# Patient Record
Sex: Female | Born: 1975
Health system: Southern US, Community
[De-identification: ages and names within clinical notes are randomized; demographics above are authoritative.]

---

## 1997-07-22 HISTORY — PX: HIP ARTHROSCOPY W/ LABRAL REPAIR: SHX1750

## 2002-08-16 ENCOUNTER — Emergency Department (HOSPITAL_COMMUNITY): Admission: EM | Admit: 2002-08-16 | Discharge: 2002-08-16 | Payer: Self-pay | Admitting: Emergency Medicine

## 2002-11-08 ENCOUNTER — Other Ambulatory Visit: Admission: RE | Admit: 2002-11-08 | Discharge: 2002-11-08 | Payer: Self-pay | Admitting: Internal Medicine

## 2004-02-25 ENCOUNTER — Emergency Department (HOSPITAL_COMMUNITY): Admission: EM | Admit: 2004-02-25 | Discharge: 2004-02-25 | Payer: Self-pay | Admitting: Emergency Medicine

## 2004-02-26 ENCOUNTER — Emergency Department (HOSPITAL_COMMUNITY): Admission: EM | Admit: 2004-02-26 | Discharge: 2004-02-26 | Payer: Self-pay | Admitting: Emergency Medicine

## 2004-02-26 ENCOUNTER — Emergency Department (HOSPITAL_COMMUNITY): Admission: EM | Admit: 2004-02-26 | Discharge: 2004-02-26 | Payer: Self-pay | Admitting: Family Medicine

## 2004-02-28 ENCOUNTER — Encounter: Admission: RE | Admit: 2004-02-28 | Discharge: 2004-02-28 | Payer: Self-pay | Admitting: Internal Medicine

## 2004-11-02 ENCOUNTER — Ambulatory Visit: Payer: Self-pay | Admitting: Infectious Diseases

## 2004-11-02 ENCOUNTER — Inpatient Hospital Stay (HOSPITAL_COMMUNITY): Admission: EM | Admit: 2004-11-02 | Discharge: 2004-11-07 | Payer: Self-pay | Admitting: Emergency Medicine

## 2004-11-07 ENCOUNTER — Ambulatory Visit: Payer: Self-pay | Admitting: Cardiology

## 2004-11-07 ENCOUNTER — Encounter: Payer: Self-pay | Admitting: Cardiology

## 2007-07-04 ENCOUNTER — Emergency Department (HOSPITAL_COMMUNITY): Admission: EM | Admit: 2007-07-04 | Discharge: 2007-07-04 | Payer: Self-pay | Admitting: Family Medicine

## 2010-12-07 NOTE — Discharge Summary (Signed)
NAME:  Patricia Liu, Patricia Liu NO.:  [id number]   MEDICAL RECORD NO.:  [id number]          PATIENT TYPE:  INP   LOCATION:  5738                         FACILITY:  [facility]   PHYSICIAN:  Michaelyn Barter, M.D. DATE OF BIRTH:  10/04/1975   DATE OF ADMISSION:  11/01/2004  DATE OF DISCHARGE:  11/07/2004                                 DISCHARGE SUMMARY   PRIMARY CARE PHYSICIAN:  Unassigned.   FINAL DIAGNOSES:  1.  Mesenteric adenitis.  2.  Shortness of breath.  3.  Elevated liver enzymes.   CONSULTANTS:  1.  Bernette Redbird, M.D., GI.  2.  Lacretia Leigh. Ninetta Lights, M.D., infectious disease.  3.  General surgery.  4.  OB/GYN.   HISTORY OF PRESENT ILLNESS:  Mrs. Bullen is a 35 year old female who  arrived in the emergency room with a chief complaint of abdominal pain and  fever over the prior 24 hours. The pain was located within the right lower  quadrant and she had been seen in the Urgent Care earlier the day of  admission. At that time, her white blood cell count was noted to be elevated  and she was referred to the hospital for further evaluation. She also  complained of some mild nausea but no vomiting.   PAST MEDICAL HISTORY:  Asthma.   PAST SURGICAL HISTORY:  Right hip arthrocentesis.   ALLERGIES:  SULFA.   FAMILY HISTORY:  Negative.   SOCIAL HISTORY:  Cigarettes:  The patient denied.  Alcohol: The patient  denied.   HOSPITAL COURSE:  Problem 1:  MESENTERIC ADENITIS:  The patient had a CT  scan of the abdomen completed and the final impression of which was abnormal  retroperitoneal stranding and stranding in the root of the mesentery with  associated like inflammatory retroperitoneal adenopathy, along with  inflammation of the adjacent structures including the right ureter and  duodenal sweep. Prominence of the head of the pancreas was also seen. No  definite mass was seen and the appearance was suggestive of common  mesenteric adenitis. Final impression from the  CT of the pelvis was that  there was abnormal retroperitoneal stranding extending slightly along the  pelvic sidewall, right greater than the left, hypodense appearance of the  right ovary potentially representing a complex right ovarian lesion, no free  pelvic fluid, the appendix did not appear to be inflamed. Because of these  findings, a transvaginal ultrasound was completed which revealed a 2 cm soft  tissue abnormality adjacent to the right ovary questionable arising from the  right ovary or representing an area of inflammation or more solid soft  tissue. Further evaluation was recommended at six to eight weeks. Following  this, the patient was started on empiric IV antibiotics with Zosyn. General  surgery was consulted and Dr. Violeta Gelinas was the physician who responded  to the consultation. He agreed that the patient should have a medical  admission along with IV fluid resuscitation and Zosyn and he stated that  there were no acute surgical issues at that particular time. In addition,  OB/GYN was consulted and Dr. Senaida Ores was  the physician who responded. At  that particular time, she had no additional recommendations but stated that  the patient was at low risk for PID and recommended observing the patient  for improvement on antibiotics. Over the course of the patient's  hospitalization, her abdominal pain gradually started to improve; however,  she had further opinion as to whether or not correct antibiotics were being  used. Infectious disease was consulted for further recommendations and their  final recommendations were that the patient actually did not need to be on  any antibiotics given her diagnosis.   Problem 2:  ELEVATED LIVER ENZYMES:  By April the 18th, the patient's liver  enzymes were noted to have gone from 30 to 134 for the SGOT. Her SGPT had  increased from 20 to 116. Therefore the decision was made to consult GI, Dr.  Bernette Redbird. Dr. Molly Maduro Buccini  evaluated the patient. He stated that the  patient more than likely had a reactive hepatopathy from infection or  medications or biliary obstruction from a common bile duct stone. He  recommended an ultrasound of the abdomen to follow the LFT's and a possible  to MRI or ERCP depending upon the results. An ultrasound of the abdomen was  completed on November 07, 2004. The final impression was completely distended  gallbladder containing a small amount of sludge, otherwise normal  examination. He went, on the following day, to state that he did not think  that any further GI evaluation was needed. The patient's antibiotics were  subsequently stopped and the patient's liver enzymes no longer continued to  increase.   Problem 3:  SHORTNESS OF BREATH:  The patient complained of some shortness  of breath over the course of her hospitalization. She had a chest x-ray  completed on April 16 which revealed interval development of bibasilar air  space disease, left greater than the right and it revealed that the findings  were more than likely compatible with infection. She had already been on  empiric antibiotics for this. Likewise to follow with results of that chest  x-ray she had a repeat chest x-ray completed on April 18th which revealed a  slight residual interstitial prominence and right base opacity significant  for improvement since the previous study done on April 16th. In addition,  the patient also had a 2-D echocardiogram completed on April 19th which was  interpreted as overall left ventricular systolic function was normal. Left  ventricular ejection fraction was estimated to range between 55-65%. There  were no left ventricular regional wall motion abnormalities. 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 By April 19th, the patient was stating  that she felt much better and that she wanted to go home. She had no shortness of breath at that particular time and her abdominal pain  had  improved significantly. Likewise, the patient's leukocytosis which should be  a number four above had also resolved. Therefore the decision was made to  discharge the patient home. The patient's vitals at the time of discharge  showed her temperature was 98.5, heart rate 98, respirations 20, blood  pressure 109/69 and she 96% on room air. The decision was made to discharge  the patient home. The patient was discharged home on the following  medications.   DISCHARGE MEDICATIONS:  1.  Z-Pak.  2.  Flexeril 5 milligrams p.o. t.i.d.  3.  Atrovent 0.5 milligrams MDI q.6h.  4.  Albuterol 2.5 milligrams MDI q.6h.  5.  Phenergan 12.5 milligrams p.o.  q.8h.   DISCHARGE INSTRUCTIONS:  The patient was instructed to see Dr. Bernette Redbird on Nov 22, 2004 for further evaluation in follow-up.       OR/MEDQ  D:  01/13/2005  T:  01/14/2005  Job:  [id number]

## 2010-12-07 NOTE — H&P (Signed)
NAME:  Patricia Liu, Patricia Liu                ACCOUNT NO.:  [id number]   MEDICAL RECORD NO.:  [id number]          PATIENT TYPE:  EMS   LOCATION:  MAJO                         FACILITY:  [facility]   PHYSICIAN:  Danae Chen, M.D.DATE OF BIRTH:  19-Nov-1975   DATE OF ADMISSION:  11/01/2004  DATE OF DISCHARGE:                                HISTORY & PHYSICAL   PRIMARY CARE PHYSICIAN:  Unassigned.   CONSULTATIONS:  Gabrielle Dare. Janee Morn, M.D. at [facility].   CHIEF COMPLAINT:  Abdominal pain and fever.   HISTORY OF PRESENT ILLNESS:  The patient is a pleasant 35 year old [place]  female who presents to the emergency room with 24-hour complaint of right  lower quadrant abdominal pain and fever. She was seen in the Urgent Care  Center earlier today and had an elevated white count and was referred to the  emergency room for further evaluation. 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 She reports that she first  noted some slight fever with abdominal pain last week. This resolved and did  not have any recurrent symptoms until yesterday. She had high fevers,  chills, and the right lower quadrant pain. She had mild nausea, no vomiting,  and no diarrhea. No hematemesis and no bright red blood per rectum. She has  no history of irregular menstrual cycles. No history of pelvic inflammatory  diseases, no history of ectopic pregnancies that she is aware. Here in the  emergency department reveals a negative urine pregnancy test as well.   PAST MEDICAL HISTORY:  Significant for asthma.   PAST SURGICAL HISTORY:  She had a right hip arthrocentesis. No other  abdominal surgeries.   ALLERGIES:  SULFA.   FAMILY HISTORY:  No history of cancer, diabetes, hypertension, or early  onset coronary artery disease to our knowledge.   SOCIAL HISTORY:  She is married. She has a daughter, Herbert Seta. She is a  Designer, jewellery. No tobacco, alcohol, or illicit drug  use.   REVIEW OF SYMPTOMS:  Per the history of present illness.   PHYSICAL EXAMINATION:  GENERAL:  She is in no acute distress. Husband at the  bed side.  VITAL SIGNS:  Temperature of 104, blood pressure 103/69, pulse 133,  respirations 20. O2 saturation 98% on room air. After receiving a bolus of  liter of saline in the ED with Tylenol, temperature is now 103.3, blood  pressure is 74/57, pulse 118, respirations 16. O2 saturation 94% on room  air.  HEENT:  Oropharynx is clear. Buccal mucosa is moist. No scleral icterus. No  scleral pallor. Head is normocephalic and atraumatic.  NECK:  Supple. No lymphadenopathy. Full range of motion . No JVD.  LUNGS:  Clear to auscultation bilaterally.  HEART:  Tachycardic but no murmurs are appreciated.  EXTREMITIES:  She has no peripheral edema. There are 2+ dorsalis pedis  pulses.  SKIN:  Warm and dry.  ABDOMEN:  She has some tenderness to palpation of her right lower quadrant  but no rebound and no guarding. Bowel sounds are active. Abdomen is  nondistended.  She has no costovertebral angle tenderness.  NEUROLOGICAL:  She is alert and oriented. No focal neurologic deficits  noted.   LABORATORY DATA:  White count of 17.2 thousand, platelets 257,000,  hemoglobin 14.3, absolute neutrophil count of 16.4 thousand. Urine pregnancy  test is noted as negative. Urinalysis showing specific gravity of 1.028,  some ketones, some proteinuria. No leukocyte esterase. No white blood cells,  no RBCs. BMET is pending. I-stat shows sodium 139, potassium 3.2, chloride  107, BUN 12.   Abdominal CT showing findings consistent with mesenteric adenitis reviewed  by the surgeon on consultation. No definitive inflammation of the appendix  is noted on the CT of the abdomen and pelvis.   IMPRESSION:  A 35 year old female with right lower quadrant abdominal pain,  fever, and leukocytosis. Consultation by general surgery is greatly  appreciated. Review of CT. I feel that  this represents mesenteric adenitis  and no indication for acute surgical abdomen at this time.   PLAN:  We will admit the patient to stepdown telemetry bed. Start empiric  broad-spectrum antibiotics with IV Zosyn, Tylenol for fever, proton pump  inhibitor, and IV morphine for her pain. We will give her p.r.n. Albuterol  nebulizers as well and Phenergan for nausea. We will check blood culture and  urine culture. A morning CBC and CMET as well. Keep the patient NPO except  for ice chips and advance to sips and clears. Advance diet as tolerated. The  patient defervesces and continues to make improvements, we will narrow her  antibiotic coverage and follow more clinically.      RLK/MEDQ  D:  11/02/2004  T:  11/02/2004  Job:  [id number]

## 2010-12-07 NOTE — Consult Note (Signed)
NAME:  Patricia Liu, Patricia Liu NO.:  [id number]   MEDICAL RECORD NO.:  [id number]          PATIENT TYPE:  INP   LOCATION:  5738                         FACILITY:  [facility]   PHYSICIAN:  Bernette Redbird, M.D.   DATE OF BIRTH:  [birth year]   DATE OF CONSULTATION:  11/06/2004  DATE OF DISCHARGE:                                   CONSULTATION   GASTROENTEROLOGY CONSULTATION:  Dr. 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   Ms. Bodin was admitted to the hospital about 4 days ago because of fevers,  chills and abdominal pain. It all started about 10 days ago when she was at  work and developed chills and temperature to 101 and joint aches. She took  Advil and felt well overnight,  but about 5 days later, had the very abrupt  onset of recurrent fevers and chills, and also some nausea without vomiting.  By the next morning, she had developed pain in the right lateral abdominal  area with radiation across the lower abdomen, and she also had pain on the  right side of her lower back. Her temperature went up to over 105 degrees,  and she was seen at Urgent Care Center and referred to the hospital for  surgical evaluation. Since admission, she has had a gradual improvement in  her pain following antibiotic therapy with Zosyn, and is now essentially  pain free.   At no time has the patient had epigastric, right upper quadrant, or  infrascapular pain. There is no family history of biliary tract disease. A  CT scan showed mesenteric adenitis without obvious abnormalities in the  liver or gallbladder. However, it was noted on today's blood work that her  liver chemistries, which were normal on admission, had risen moderately.  Specifically, her AST has gone from 30 to 134. Her ALT from 20 to 116. Her  alkaline phosphatase and bilirubin remained in the normal range.   PAST MEDICAL  HISTORY:   ALLERGIES:  No known allergies.   OUTPATIENT MEDICATIONS:  None.   OPERATIONS:  No abdominal surgery.   CHRONIC MEDICAL ILLNESSES:  Some mild asthma. No cardiopulmonary disease,  diabetes, or hypertension.   HABITS:  Nonsmoker, nondrinker.   FAMILY HISTORY:  Negative for biliary tract disease.   SOCIAL HISTORY:  Married, one child. 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   REVIEW OF SYSTEMS:  No prior episodes of this type.   PHYSICAL EXAMINATION:  VITAL SIGNS:  Afebrile for the past 24 hours.  HEENT:  Anicteric. No evident pallor.  GENERAL:  No distress whatsoever.  CHEST: Clear.  HEART: Normal.  ABDOMEN:  Has bowel sounds present. No frank distension, although perhaps a  little bit of puffiness or mild distension. No mass effect or significant  tenderness, perhaps just a little bit of guarding.   LABORATORY DATA:  Per above.  White count today is finally normal at 8400  with an unremarkable differential. Hemoglobin 10.8 with MCV which has been  in the  normal range, platelets 372,000. Liver chemistries as described  above.   IMPRESSION:  Mild elevation of liver chemistries in a hepatocellular  pattern, not present at time of close clinical presentation, in a patient  with a febrile illness associated with mesenteric adenitis but no risk  factors for classic symptoms for biliary tract disease.   DISCUSSION AND PLAN:  The patient's fever with abrupt onset of fever and  chills would, by itself, not the advanced story for cholangitis. However,  her pain has not been in the usual right upper quadrant location for that  condition, and there are really no significant risk factors for biliary  tract disease. Moreover, I would have expected the patient's liver  chemistries to be elevated at the time of admission if this were the source  of her fevers and chills, and they were not elevated at that time. Thus, I  think the mild rise at this time is probably  reflective of a reactive  hepatopathy either due to the illness itself or to the medications  (specifically Zosyn) which is being given to treat it.   RECOMMENDATIONS:  1.  Right upper quadrant ultrasound in the morning to check for evidence of      gallbladder stones or biliary ductal dilatation. If present, would favor      surgical consultation.  2.  Continue to follow LFTs. If the ultrasound is negative for evidence of      stones but the LFTs remain elevated, evaluation with an MRCP or an ERCP      might be appropriate.   I appreciate the opportunity to have seen this patient in consultation with  you.      RB/MEDQ  D:  11/06/2004  T:  11/06/2004  Job:  [id number]   cc:   Gabrielle Dare. Janee Morn, M.D.  [facility]  [address] [place], [place] [address]

## 2010-12-07 NOTE — Consult Note (Signed)
NAME:  Patricia Liu, Patricia Liu NO.:  [id number]   MEDICAL RECORD NO.:  [id number]          PATIENT TYPE:  EMS   LOCATION:  MAJO                         FACILITY:  [facility]   PHYSICIAN:  Gabrielle Dare. Janee Morn, M.D.DATE OF BIRTH:  [birth year]   DATE OF CONSULTATION:  11/02/2004  DATE OF DISCHARGE:                                   CONSULTATION   REFERRING PHYSICIAN:  Paula Libra, MD   REASON FOR CONSULTATION:  Abdominal pain.   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  It persisted and  traveled down to her right lower quadrant and suprapubic area.  She was seen  in urgent care and noted to have a fever of 104; she was promptly sent to  the [facility] Emergency Department for evaluation.  Workup here shows her  temperature to be elevated at 103.3, white blood cell count of 17.2 with a  left shift.  Urinalysis is negative.  CT scan of the abdomen and pelvis was  then obtained; this demonstrates mesenteric and retroperitoneal adenitis  with a normal-appearing appendix and cecum.  The patient has continued to  have some lower abdominal pain, but denies any nausea.   PAST MEDICAL HISTORY:  Asthma.   PAST SURGICAL HISTORY:  None.   SOCIAL HISTORY:  She works as a Engineer, civil (consulting) at [employer].  She does not smoke or  drink.   MEDICATIONS:  Albuterol inhaler p.r.n.   REVIEW OF SYSTEMS:  GENERAL:  Fever.  CARDIAC:  Negative.  PULMONARY:  Negative.  GI:  See the history of present illness, but aside from the above  is negative.  GU:  Negative.  NEUROPSYCHIATRIC:  Negative.  MUSCULOSKELETAL:  Negative.   PHYSICAL EXAM:  VITAL SIGNS:  Temperature is 103.3, blood pressure is 90/60,  heart rate is 118, respirations 16, saturations are 94% on room air.  GENERAL:  She is awake and alert, in no acute distress.  HEENT:  Pupils are equal and reactive.  Sclerae are clear.  NECK:  Neck is supple with no masses.  LUNGS:  Lungs are clear to auscultation.  HEART:  Regular rate and rhythm.  PMI is palpable laterally in the left  chest.  ABDOMEN:  Abdomen is soft.  She has some mild-to-moderate lower abdominal  tenderness, more on the right than the left; this does extend up to the  right flank.  She has negative psoas sign and negative Rovsing's sign.  Bowel sounds are hypoactive.  There is no guarding and no peritoneal signs.  SKIN:  Skin is warm and dry.  MUSCULOSKELETAL:  Strength is equal in upper and lower extremities.   DATA REVIEW:  Data reviewed included white blood cell count 17.2; urinalysis  negative; hemoglobin 14.3; basic metabolic panel unremarkable with the  exception of a potassium of 3.2.   CT scan of the abdomen and pelvis demonstrates a mesenteric and  retroperitoneal adenitis with some secondary inflammation of her right  ureter.  She has a tiny left renal stone.  The appendix is  normal and the  cecum also appears normal.   IMPRESSION AND PLAN:  Mesenteric adenitis.  I agree with medical admission,  IV fluid resuscitation and intravenous Zosyn.  The patient may have clear  liquids as there are no acute surgical issues.  We will follow along with  you to make sure she continues to improve.  The plan was discussed in detail  with the patient and with the [facility] physician who is on call.      BET/MEDQ  D:  11/02/2004  T:  11/02/2004  Job:  [id number]

## 2011-03-23 HISTORY — PX: OTHER SURGICAL HISTORY: SHX169

## 2011-11-27 ENCOUNTER — Ambulatory Visit (INDEPENDENT_AMBULATORY_CARE_PROVIDER_SITE_OTHER): Payer: BC Managed Care – PPO | Admitting: Obstetrics and Gynecology

## 2011-11-27 ENCOUNTER — Encounter: Payer: Self-pay | Admitting: Obstetrics and Gynecology

## 2011-11-27 VITALS — BP 110/70 | Ht 65.0 in | Wt 120.0 lb

## 2011-11-27 DIAGNOSIS — IMO0001 Reserved for inherently not codable concepts without codable children: Secondary | ICD-10-CM

## 2011-11-27 DIAGNOSIS — Z124 Encounter for screening for malignant neoplasm of cervix: Secondary | ICD-10-CM

## 2011-11-27 DIAGNOSIS — Z309 Encounter for contraceptive management, unspecified: Secondary | ICD-10-CM

## 2011-11-27 DIAGNOSIS — N920 Excessive and frequent menstruation with regular cycle: Secondary | ICD-10-CM

## 2011-11-27 NOTE — Progress Notes (Signed)
Last Pap: 2005 WNL: Yes Regular Periods:yes Contraception: condoms  Monthly Breast exam:yes Tetanus<72yrs:yes Nl.Bladder Function:yes Daily BMs:yes Healthy Diet:yes Calcium:yes Mammogram:no Exercise:yes Seatbelt: yes Abuse at home: no Stressful work:no Sigmoid-colonoscopy: n/a Bone Density: No  Pt c/o heavy menstrual cycles changing pad every 30 mins with some mild cramping.  This has happened only a few times over the last year.  No CP or SOB.  No bleeding disorders.    Pt want IUD for birth control and to help with heavy cycles.  No h/o of ectopic, or PID. Physical Examination: General appearance - alert, well appearing, and in no distress Mental status - normal mood, behavior, speech, dress, motor activity, and thought processes Neck - supple, no significant adenopathy, thyroid exam: thyroid is normal in size without nodules or tenderness Chest - clear to auscultation, no wheezes, rales or rhonchi, symmetric air entry Heart - normal rate and regular rhythm Abdomen - soft, nontender, nondistended, no masses or organomegaly Breasts - breasts appear normal, no suspicious masses, no skin or nipple changes or axillary nodes Pelvic - normal external genitalia, vulva, vagina, cervix, uterus and adnexa Rectal - rectal exam not indicated Back exam - full range of motion, no tenderness, palpable spasm or pain on motion Neurological - alert, oriented, normal speech, no focal findings or movement disorder noted Musculoskeletal - no joint tenderness, deformity or swelling Extremities - no edema, redness or tenderness in the calves or thighs Skin - normal coloration and turgor, no rashes, no suspicious skin lesions noted Routine exam Menorrhagia Contraceptive counseling Pap sent yes Mammogram due no *Mirena RT for insertion and pelvic US.  Pt declined embx

## 2011-11-27 NOTE — Patient Instructions (Signed)
Patient Education Materials to be provided at check out (*indicates is located in accordion folder):  *Mirena  

## 2011-11-29 LAB — PAP IG, CT-NG, RFX HPV ASCU
Chlamydia Probe Amp: NEGATIVE
GC Probe Amp: NEGATIVE

## 2011-12-09 ENCOUNTER — Other Ambulatory Visit: Payer: Self-pay | Admitting: Obstetrics and Gynecology

## 2011-12-09 ENCOUNTER — Ambulatory Visit (INDEPENDENT_AMBULATORY_CARE_PROVIDER_SITE_OTHER): Payer: BC Managed Care – PPO | Admitting: Obstetrics and Gynecology

## 2011-12-09 ENCOUNTER — Ambulatory Visit (INDEPENDENT_AMBULATORY_CARE_PROVIDER_SITE_OTHER): Payer: BC Managed Care – PPO

## 2011-12-09 DIAGNOSIS — N920 Excessive and frequent menstruation with regular cycle: Secondary | ICD-10-CM

## 2011-12-09 MED ORDER — LEVONORGESTREL 13.5 MG IU IUD
INTRAUTERINE_SYSTEM | Freq: Once | INTRAUTERINE | Status: AC
  Administered 2011-12-09: 09:00:00 via INTRAUTERINE

## 2011-12-09 NOTE — Progress Notes (Signed)
Pt here for [place] and IUD insertion.  She has no contraindications.  Consent signed.   US demonstrated normal endometrial stripe and pt still declined EMBX.  Coronal view only 2.2 cm width.  skyla inserted instead of mirena.  Pt agreed with this IUD INSERTION NOTE   Consent signed after risks and benefits were reviewed including but not limited to bleeding, infection and risk of uterine perforation.  LMP: Patient's last menstrual period was 11/07/2011. UPT: Negative GC / Chlamydia: negative  SKYLA SERIAL NUMBER: [id number]  Prepping with Betadine Tenaculum placed on anterior lip of cervix Uterus sounded at  8 cm Insertion of SKYLA IUD per protocol without any complications  POST-PROCEDURE:  Patient instructed to call with fever or excessive pain Patient instructed to check IUD strings after each menstrual cycle   Follow-up: 4-5 weeks   Patricia Liu A MD 12/09/2011 9:14 AM

## 2012-01-03 ENCOUNTER — Encounter: Payer: Self-pay | Admitting: Obstetrics and Gynecology

## 2012-01-03 ENCOUNTER — Ambulatory Visit (INDEPENDENT_AMBULATORY_CARE_PROVIDER_SITE_OTHER): Payer: BC Managed Care – PPO | Admitting: Obstetrics and Gynecology

## 2012-01-03 VITALS — BP 90/68 | Temp 98.5°F | Resp 16 | Ht 65.0 in | Wt 115.0 lb

## 2012-01-03 DIAGNOSIS — Z30431 Encounter for routine checking of intrauterine contraceptive device: Secondary | ICD-10-CM

## 2012-01-03 NOTE — Progress Notes (Signed)
Pt here for Skyla insertion f/u IUD SURVEILLANCE NOTE  Skyla IUD inserted on 4 weeks ago  Pain: none Bleeding: moderate Patient has checked IUD strings: yes Other complaints: no  EXAM:  IUD strings visualized: yes              Pelvic exam normal: yes  Next visit with annual exam or    DILLARD,NAIMA A MD 01/03/2012 11:48 AM

## 2012-06-07 ENCOUNTER — Encounter (HOSPITAL_COMMUNITY): Payer: Self-pay | Admitting: Emergency Medicine

## 2012-06-07 ENCOUNTER — Emergency Department (HOSPITAL_COMMUNITY)
Admission: EM | Admit: 2012-06-07 | Discharge: 2012-06-07 | Disposition: A | Discharge status: Home / Self Care | Payer: PRIVATE HEALTH INSURANCE | Attending: Emergency Medicine | Admitting: Emergency Medicine

## 2012-06-07 DIAGNOSIS — Z79899 Other long term (current) drug therapy: Secondary | ICD-10-CM | POA: Insufficient documentation

## 2012-06-07 DIAGNOSIS — Z23 Encounter for immunization: Secondary | ICD-10-CM | POA: Insufficient documentation

## 2012-06-07 DIAGNOSIS — T148XXA Other injury of unspecified body region, initial encounter: Secondary | ICD-10-CM

## 2012-06-07 DIAGNOSIS — J45909 Unspecified asthma, uncomplicated: Secondary | ICD-10-CM | POA: Insufficient documentation

## 2012-06-07 DIAGNOSIS — IMO0002 Reserved for concepts with insufficient information to code with codable children: Secondary | ICD-10-CM | POA: Insufficient documentation

## 2012-06-07 DIAGNOSIS — Y990 Civilian activity done for income or pay: Secondary | ICD-10-CM

## 2012-06-07 MED ORDER — CEPHALEXIN 500 MG PO CAPS: 500.0000 mg | ORAL_CAPSULE | Freq: Two times a day (BID) | ORAL | Status: DC

## 2012-06-07 MED ORDER — TETANUS-DIPHTH-ACELL PERTUSSIS 5-2.5-18.5 LF-MCG/0.5 IM SUSP
0.5000 mL | Freq: Once | INTRAMUSCULAR | Status: AC
  Administered 2012-06-07: 0.5 mL via INTRAMUSCULAR
  Filled 2012-06-07: qty 0.5

## 2012-06-07 MED ORDER — BACITRACIN ZINC 500 UNIT/GM EX OINT
TOPICAL_OINTMENT | CUTANEOUS | Status: AC
  Administered 2012-06-07: 1
  Filled 2012-06-07: qty 2.7

## 2012-06-07 NOTE — ED Notes (Signed)
Pt presents to ED with an on abrasion on L forearm.  Pt stated she was at work for [employer] when a pt dug her nail into her.  Pt is concerned and wants to be evaluated.
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
## 2012-06-08 NOTE — ED Provider Notes (Signed)
Medical screening examination/treatment/procedure(s) were performed by non-physician practitioner and as supervising physician I was immediately available for consultation/collaboration.  Anahid Eskelson R. Lonia Roane, MD 06/08/12 2032 

## 2014-05-23 ENCOUNTER — Encounter (HOSPITAL_COMMUNITY): Payer: Self-pay | Admitting: Emergency Medicine

## 2017-11-10 ENCOUNTER — Other Ambulatory Visit: Payer: Self-pay

## 2017-11-10 ENCOUNTER — Emergency Department (HOSPITAL_BASED_OUTPATIENT_CLINIC_OR_DEPARTMENT_OTHER)
Admission: EM | Admit: 2017-11-10 | Discharge: 2017-11-10 | Disposition: A | Discharge status: Home / Self Care | Payer: Managed Care, Other (non HMO) | Attending: Emergency Medicine | Admitting: Emergency Medicine

## 2017-11-10 ENCOUNTER — Encounter (HOSPITAL_BASED_OUTPATIENT_CLINIC_OR_DEPARTMENT_OTHER): Payer: Self-pay | Admitting: Emergency Medicine

## 2017-11-10 ENCOUNTER — Emergency Department (HOSPITAL_BASED_OUTPATIENT_CLINIC_OR_DEPARTMENT_OTHER): Payer: Managed Care, Other (non HMO)

## 2017-11-10 DIAGNOSIS — J45909 Unspecified asthma, uncomplicated: Secondary | ICD-10-CM | POA: Insufficient documentation

## 2017-11-10 DIAGNOSIS — S022XXA Fracture of nasal bones, initial encounter for closed fracture: Secondary | ICD-10-CM

## 2017-11-10 DIAGNOSIS — Z79899 Other long term (current) drug therapy: Secondary | ICD-10-CM | POA: Insufficient documentation

## 2017-11-10 DIAGNOSIS — Z3202 Encounter for pregnancy test, result negative: Secondary | ICD-10-CM | POA: Insufficient documentation

## 2017-11-10 DIAGNOSIS — S0990XA Unspecified injury of head, initial encounter: Secondary | ICD-10-CM

## 2017-11-10 DIAGNOSIS — Y929 Unspecified place or not applicable: Secondary | ICD-10-CM | POA: Diagnosis not present

## 2017-11-10 DIAGNOSIS — S0181XA Laceration without foreign body of other part of head, initial encounter: Secondary | ICD-10-CM | POA: Diagnosis not present

## 2017-11-10 DIAGNOSIS — Y999 Unspecified external cause status: Secondary | ICD-10-CM | POA: Insufficient documentation

## 2017-11-10 DIAGNOSIS — Y939 Activity, unspecified: Secondary | ICD-10-CM | POA: Diagnosis not present

## 2017-11-10 DIAGNOSIS — S0992XA Unspecified injury of nose, initial encounter: Secondary | ICD-10-CM | POA: Diagnosis present

## 2017-11-10 LAB — PREGNANCY, URINE: Preg Test, Ur: NEGATIVE

## 2017-11-10 NOTE — ED Notes (Signed)
ED Provider at bedside. 
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
## 2018-01-05 ENCOUNTER — Ambulatory Visit: Payer: Self-pay | Admitting: *Deleted
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
## 2018-01-06 ENCOUNTER — Ambulatory Visit: Payer: Managed Care, Other (non HMO) | Admitting: Emergency Medicine

## 2018-01-06 ENCOUNTER — Encounter: Payer: Self-pay | Admitting: Emergency Medicine

## 2018-01-06 ENCOUNTER — Telehealth: Payer: Self-pay

## 2018-01-06 VITALS — BP 124/86 | HR 91 | Temp 98.7°F | Resp 17 | Ht 63.5 in | Wt 134.0 lb

## 2018-01-06 DIAGNOSIS — F418 Other specified anxiety disorders: Secondary | ICD-10-CM | POA: Insufficient documentation

## 2018-01-06 DIAGNOSIS — I10 Essential (primary) hypertension: Secondary | ICD-10-CM | POA: Diagnosis not present

## 2018-01-06 MED ORDER — PROPRANOLOL HCL 40 MG PO TABS: 40.0000 mg | ORAL_TABLET | Freq: Two times a day (BID) | ORAL | 1 refills | Status: DC

## 2018-01-06 NOTE — Patient Instructions (Addendum)
   IF you received an x-ray today, you will receive an invoice from [facility]. Please contact [facility] at [phone] with questions or concerns regarding your invoice.   IF you received labwork today, you will receive an invoice from [facility]. Please contact [facility] at [phone] with questions or concerns regarding your invoice.   Our billing staff will not be able to assist you with questions regarding bills from these companies.  You will be contacted with the lab results as soon as they are available. The fastest way to get your results is to activate your My Chart account. Instructions are located on the last page of this paperwork. If you have not heard from us regarding the results in 2 weeks, please contact this office.     Hypertension Hypertension, commonly called high blood pressure, is when the force of blood pumping through the arteries is too strong. The arteries are the blood vessels that carry blood from the heart throughout the body. Hypertension forces the heart to work harder to pump blood and may cause arteries to become narrow or stiff. Having untreated or uncontrolled hypertension can cause heart attacks, strokes, kidney disease, and other problems. A blood pressure reading consists of a higher number over a lower number. Ideally, your blood pressure should be below 120/80. The first ("top") number is called the systolic pressure. It is a measure of the pressure in your arteries as your heart beats. The second ("bottom") number is called the diastolic pressure. It is a measure of the pressure in your arteries as the heart relaxes. What are the causes? The cause of this condition is not known. What increases the risk? Some risk factors for high blood pressure are under your control. Others are not. Factors you can change  Smoking.  Having type 2 diabetes mellitus, high cholesterol, or both.  Not getting enough exercise or physical  activity.  Being overweight.  Having too much fat, sugar, calories, or salt (sodium) in your diet.  Drinking too much alcohol. Factors that are difficult or impossible to change  Having chronic kidney disease.  Having a family history of high blood pressure.  Age. Risk increases with age.  Race. You may be at higher risk if you are African-American.  Gender. Men are at higher risk than women before age 45. After age 65, women are at higher risk than men.  Having obstructive sleep apnea.  Stress. What are the signs or symptoms? Extremely high blood pressure (hypertensive crisis) may cause:  Headache.  Anxiety.  Shortness of breath.  Nosebleed.  Nausea and vomiting.  Severe chest pain.  Jerky movements you cannot control (seizures).  How is this diagnosed? This condition is diagnosed by measuring your blood pressure while you are seated, with your arm resting on a surface. The cuff of the blood pressure monitor will be placed directly against the skin of your upper arm at the level of your heart. It should be measured at least twice using the same arm. Certain conditions can cause a difference in blood pressure between your right and left arms. Certain factors can cause blood pressure readings to be lower or higher than normal (elevated) for a short period of time:  When your blood pressure is higher when you are in a health care provider's office than when you are at home, this is called white coat hypertension. Most people with this condition do not need medicines.  When your blood pressure is higher at home than when you   are in a health care provider's office, this is called masked hypertension. Most people with this condition may need medicines to control blood pressure.  If you have a high blood pressure reading during one visit or you have normal blood pressure with other risk factors:  You may be asked to return on a different day to have your blood pressure  checked again.  You may be asked to monitor your blood pressure at home for 1 week or longer.  If you are diagnosed with hypertension, you may have other blood or imaging tests to help your health care provider understand your overall risk for other conditions. How is this treated? This condition is treated by making healthy lifestyle changes, such as eating healthy foods, exercising more, and reducing your alcohol intake. Your health care provider may prescribe medicine if lifestyle changes are not enough to get your blood pressure under control, and if:  Your systolic blood pressure is above 130.  Your diastolic blood pressure is above 80.  Your personal target blood pressure may vary depending on your medical conditions, your age, and other factors. Follow these instructions at home: Eating and drinking  Eat a diet that is high in fiber and potassium, and low in sodium, added sugar, and fat. An example eating plan is called the DASH (Dietary Approaches to Stop Hypertension) diet. To eat this way: ? Eat plenty of fresh fruits and vegetables. Try to fill half of your plate at each meal with fruits and vegetables. ? Eat whole grains, such as whole wheat pasta, brown rice, or whole grain bread. Fill about one quarter of your plate with whole grains. ? Eat or drink low-fat dairy products, such as skim milk or low-fat yogurt. ? Avoid fatty cuts of meat, processed or cured meats, and poultry with skin. Fill about one quarter of your plate with lean proteins, such as fish, chicken without skin, beans, eggs, and tofu. ? Avoid premade and processed foods. These tend to be higher in sodium, added sugar, and fat.  Reduce your daily sodium intake. Most people with hypertension should eat less than 1,500 mg of sodium a day.  Limit alcohol intake to no more than 1 drink a day for nonpregnant women and 2 drinks a day for men. One drink equals 12 oz of beer, 5 oz of wine, or 1 oz of hard  liquor. Lifestyle  Work with your health care provider to maintain a healthy body weight or to lose weight. Ask what an ideal weight is for you.  Get at least 30 minutes of exercise that causes your heart to beat faster (aerobic exercise) most days of the week. Activities may include walking, swimming, or biking.  Include exercise to strengthen your muscles (resistance exercise), such as pilates or lifting weights, as part of your weekly exercise routine. Try to do these types of exercises for 30 minutes at least 3 days a week.  Do not use any products that contain nicotine or tobacco, such as cigarettes and e-cigarettes. If you need help quitting, ask your health care provider.  Monitor your blood pressure at home as told by your health care provider.  Keep all follow-up visits as told by your health care provider. This is important. Medicines  Take over-the-counter and prescription medicines only as told by your health care provider. Follow directions carefully. Blood pressure medicines must be taken as prescribed.  Do not skip doses of blood pressure medicine. Doing this puts you at risk for problems and   can make the medicine less effective.  Ask your health care provider about side effects or reactions to medicines that you should watch for. Contact a health care provider if:  You think you are having a reaction to a medicine you are taking.  You have headaches that keep coming back (recurring).  You feel dizzy.  You have swelling in your ankles.  You have trouble with your vision. Get help right away if:  You develop a severe headache or confusion.  You have unusual weakness or numbness.  You feel faint.  You have severe pain in your chest or abdomen.  You vomit repeatedly.  You have trouble breathing. Summary  Hypertension is when the force of blood pumping through your arteries is too strong. If this condition is not controlled, it may put you at risk for serious  complications.  Your personal target blood pressure may vary depending on your medical conditions, your age, and other factors. For most people, a normal blood pressure is less than 120/80.  Hypertension is treated with lifestyle changes, medicines, or a combination of both. Lifestyle changes include weight loss, eating a healthy, low-sodium diet, exercising more, and limiting alcohol. This information is not intended to replace advice given to you by your health care provider. Make sure you discuss any questions you have with your health care provider. Document Released: 07/08/2005 Document Revised: 06/05/2016 Document Reviewed: 06/05/2016 Elsevier Interactive Patient Education  2018 Elsevier Inc.  

## 2018-01-06 NOTE — Progress Notes (Signed)
Patricia Liu 42 y.o.   Chief Complaint  Patient presents with  . Hypertension    HISTORY OF PRESENT ILLNESS: [name] complaining of elevated blood pressure readings over the past weekend.  Systolics between 140 -160 with diastolics over 100.  Was started on Inderal 40 mg twice a day by a nurse practitioner and has been taking it for the past 3 days.  Working well.  Requesting to be started on Inderal. Under increased emotional duress due to marital relationship.  Gets intermittent palpitations but no other significant symptoms.  HPI   Prior to Admission medications   Medication Sig Start Date End Date Taking? Authorizing Provider  albuterol (PROVENTIL HFA;VENTOLIN HFA) 108 (90 BASE) MCG/ACT inhaler Inhale 2 puffs into the lungs every 6 (six) hours as needed.   Yes [provider]    Allergies  Allergen Reactions  . Zosyn [Piperacillin Sod-Tazobactam So] Other (See Comments)    Hepatic reaction, LFTs increased dramatically   . Sulfa Antibiotics Swelling and Other (See Comments)    There are no active problems to display for this patient.   Past Medical History:  Diagnosis Date  . Asthma     Past Surgical History:  Procedure Laterality Date  . HIP ARTHROSCOPY W/ LABRAL REPAIR  1999  . wisdom tooth  03/2011    Social History   Socioeconomic History  . Marital status: Married    Spouse name: Not on file  . Number of children: Not on file  . Years of education: Not on file  . Highest education level: Not on file  Occupational History  . Not on file  Social Needs  . Financial resource strain: Not on file  . Food insecurity:    Worry: Not on file    Inability: Not on file  . Transportation needs:    Medical: Not on file    Non-medical: Not on file  Tobacco Use  . Smoking status: Never Smoker  . Smokeless tobacco: Never Used  Substance and Sexual Activity  . Alcohol use: No  . Drug use: No  . Sexual activity: Yes    Birth  control/protection: IUD    Comment: Skyla  Lifestyle  . Physical activity:    Days per week: Not on file    Minutes per session: Not on file  . Stress: Not on file  Relationships  . Social connections:    Talks on phone: Not on file    Gets together: Not on file    Attends religious service: Not on file    Active member of club or organization: Not on file    Attends meetings of clubs or organizations: Not on file    Relationship status: Not on file  . Intimate partner violence:    Fear of current or ex partner: Not on file    Emotionally abused: Not on file    Physically abused: Not on file    Forced sexual activity: Not on file  Other Topics Concern  . Not on file  Social History Narrative  . Not on file    Family History  Problem Relation Age of Onset  . Cancer Paternal Grandfather      Review of Systems  Constitutional: Negative.  Negative for chills and fever.  HENT: Negative.  Negative for congestion, nosebleeds and sore throat.   Eyes: Negative for blurred vision and double vision.  Respiratory: Negative for cough and shortness of breath.   Cardiovascular: Positive for  palpitations. Negative for chest pain.  Gastrointestinal: Negative.  Negative for abdominal pain, diarrhea, nausea and vomiting.  Genitourinary: Negative.  Negative for dysuria and hematuria.  Musculoskeletal: Negative for back pain, myalgias and neck pain.  Skin: Negative.  Negative for rash.  Neurological: Negative.  Negative for dizziness and headaches.  Endo/Heme/Allergies: Negative.   All other systems reviewed and are negative.   Vitals:   01/06/18 1217  BP: 124/86  Pulse: 91  Resp: 17  Temp: 98.7 F (37.1 C)  SpO2: 98%    Physical Exam  Constitutional: She is oriented to person, place, and time. She appears well-developed and well-nourished.  HENT:  Head: Normocephalic and atraumatic.  Nose: Nose normal.  Mouth/Throat: Oropharynx is clear and moist.  Eyes: Pupils are equal,  round, and reactive to light. Conjunctivae and EOM are normal.  Neck: Normal range of motion. Neck supple. No JVD present. No thyromegaly present.  Cardiovascular: Normal rate, regular rhythm and normal heart sounds.  Pulmonary/Chest: Effort normal and breath sounds normal.  Musculoskeletal: Normal range of motion.  Neurological: She is alert and oriented to person, place, and time.  Skin: Skin is warm and dry. Capillary refill takes less than 2 seconds.  Psychiatric: She has a normal mood and affect. Her behavior is normal.  Vitals reviewed.  EKG: Normal sinus rhythm with ventricular rate of 81/min.  No acute ischemic changes.  Completely normal EKG.  ASSESSMENT & PLAN: Patricia Liu was seen today for hypertension.  Diagnoses and all orders for this visit:  Essential hypertension -     propranolol (INDERAL) 40 MG tablet; Take 1 tablet (40 mg total) by mouth 2 (two) times daily. -     EKG 12-Lead -     CBC with Differential/Platelet -     Comprehensive metabolic panel -     Thyroid Panel With TSH  Situational anxiety    Patient Instructions       IF you received an x-ray today, [facility]. Please [facility] at [phone] with questions or concerns regarding your invoice.   IF you received labwork today, you will receive an invoice from [facility]. Please contact [facility] at [phone] with questions or concerns regarding your invoice.   Our billing staff will not be able to assist you with questions regarding bills from these companies.  You will be contacted with the lab results as soon as they are available. The fastest way to get your results is to activate your My Chart account. Instructions are located on the last page of this paperwork. If you have not heard from us regarding the results in 2 weeks, please contact this office.      Hypertension Hypertension, commonly called high blood pressure, is when the  force of blood pumping through the arteries is too strong. The arteries are the blood vessels that carry blood from the heart throughout the body. Hypertension forces the heart to work harder to pump blood and may cause arteries to become narrow or stiff. Having untreated or uncontrolled hypertension can cause heart attacks, strokes, kidney disease, and other problems. A blood pressure reading consists of a higher number over a lower number. Ideally, your blood pressure should be below 120/80. The first ("top") number is called the systolic pressure. It is a measure of the pressure in your arteries as your heart beats. The second ("bottom") number is called the diastolic pressure. It is a measure of the pressure in your arteries as the heart  relaxes. What are the causes? The cause of this condition is not known. What increases the risk? Some risk factors for high blood pressure are under your control. Others are not. Factors you can change  Smoking.  Having type 2 diabetes mellitus, high cholesterol, or both.  Not getting enough exercise or physical activity.  Being overweight.  Having too much fat, sugar, calories, or salt (sodium) in your diet.  Drinking too much alcohol. Factors that are difficult or impossible to change  Having chronic kidney disease.  Having a family history of high blood pressure.  Age. Risk increases with age.  Race. You may be at higher risk if you are African-American.  Gender. Men are at higher risk than women before age 4. After age 1, women are at higher risk than men.  Having obstructive sleep apnea.  Stress. What are the signs or symptoms? Extremely high blood pressure (hypertensive crisis) may cause:  Headache.  Anxiety.  Shortness of breath.  Nosebleed.  Nausea and vomiting.  Severe chest pain.  Jerky movements you cannot control (seizures).  How is this diagnosed? This condition is diagnosed by measuring your blood pressure while  you are seated, with your arm resting on a surface. The cuff of the blood pressure monitor will be placed directly against the skin of your upper arm at the level of your heart. It should be measured at least twice using the same arm. Certain conditions can cause a difference in blood pressure between your right and left arms. Certain factors can cause blood pressure readings to be lower or higher than normal (elevated) for a short period of time:  When your blood pressure is higher when you are in a health care provider's office than when you are at home, this is called white coat hypertension. Most people with this condition do not need medicines.  When your blood pressure is higher at home than when you are in a health care provider's office, this is called masked hypertension. Most people with this condition may need medicines to control blood pressure.  If you have a high blood pressure reading during one visit or you have normal blood pressure with other risk factors:  You may be asked to return on a different day to have your blood pressure checked again.  You may be asked to monitor your blood pressure at home for 1 week or longer.  If you are diagnosed with hypertension, you may have other blood or imaging tests to help your health care provider understand your overall risk for other conditions. How is this treated? This condition is treated by making healthy lifestyle changes, such as eating healthy foods, exercising more, and reducing your alcohol intake. Your health care provider may prescribe medicine if lifestyle changes are not enough to get your blood pressure under control, and if:  Your systolic blood pressure is above 130.  Your diastolic blood pressure is above 80.  Your personal target blood pressure may vary depending on your medical conditions, your age, and other factors. Follow these instructions at home: Eating and drinking  Eat a diet that is high in fiber and  potassium, and low in sodium, added sugar, and fat. An example eating plan is called the DASH (Dietary Approaches to Stop Hypertension) diet. To eat this way: ? Eat plenty of fresh fruits and vegetables. Try to fill half of your plate at each meal with fruits and vegetables. ? Eat whole grains, such as whole wheat pasta, brown rice, or  whole grain bread. Fill about one quarter of your plate with whole grains. ? Eat or drink low-fat dairy products, such as skim milk or low-fat yogurt. ? Avoid fatty cuts of meat, processed or cured meats, and poultry with skin. Fill about one quarter of your plate with lean proteins, such as fish, chicken without skin, beans, eggs, and tofu. ? Avoid premade and processed foods. These tend to be higher in sodium, added sugar, and fat.  Reduce your daily sodium intake. Most people with hypertension should eat less than 1,500 mg of sodium a day.  Limit alcohol intake to no more than 1 drink a day for nonpregnant women and 2 drinks a day for men. One drink equals 12 oz of beer, 5 oz of wine, or 1 oz of hard liquor. Lifestyle  Work with your health care provider to maintain a healthy body weight or to lose weight. Ask what an ideal weight is for you.  Get at least 30 minutes of exercise that causes your heart to beat faster (aerobic exercise) most days of the week. Activities may include walking, swimming, or biking.  Include exercise to strengthen your muscles (resistance exercise), such as pilates or lifting weights, as part of your weekly exercise routine. Try to do these types of exercises for 30 minutes at least 3 days a week.  Do not use any products that contain nicotine or tobacco, such as cigarettes and e-cigarettes. If you need help quitting, ask your health care provider.  Monitor your blood pressure at home as told by your health care provider.  Keep all follow-up visits as told by your health care provider. This is important. Medicines  Take  over-the-counter and prescription medicines only as told by your health care provider. Follow directions carefully. Blood pressure medicines must be taken as prescribed.  Do not skip doses of blood pressure medicine. Doing this puts you at risk for problems and can make the medicine less effective.  Ask your health care provider about side effects or reactions to medicines that you should watch for. Contact a health care provider if:  You think you are having a reaction to a medicine you are taking.  You have headaches that keep coming back (recurring).  You feel dizzy.  You have swelling in your ankles.  You have trouble with your vision. Get help right away if:  You develop a severe headache or confusion.  You have unusual weakness or numbness.  You feel faint.  You have severe pain in your chest or abdomen.  You vomit repeatedly.  You have trouble breathing. Summary  Hypertension is when the force of blood pumping through your arteries is too strong. If this condition is not controlled, it may put you at risk for serious complications.  Your personal target blood pressure may vary depending on your medical conditions, your age, and other factors. For most people, a normal blood pressure is less than 120/80.  Hypertension is treated with lifestyle changes, medicines, or a combination of both. Lifestyle changes include weight loss, eating a healthy, low-sodium diet, exercising more, and limiting alcohol. This information is not intended to replace advice given to you by your health care provider. Make sure you discuss any questions you have with your health care provider. Document Released: 07/08/2005 Document Revised: 06/05/2016 Document Reviewed: 06/05/2016 Elsevier Interactive Patient Education  2018 [employer]vier Inc.      Edwina Barth, MD [facility]

## 2018-01-06 NOTE — Telephone Encounter (Signed)
Copied from CRM [phone]#[id number]. Topic: Quick Communication - See Telephone Encounter >> Jan 06, 2018 10:50 AM Ninfa MeekerPoole, Bridgett H wrote: CRM for notification. See Telephone encounter for: 01/06/18.  Called and left voicemail for patient to call us back to set up new pt appt per email request

## 2018-01-07 ENCOUNTER — Encounter: Payer: Self-pay | Admitting: *Deleted

## 2018-01-07 LAB — COMPREHENSIVE METABOLIC PANEL
ALT: 15 IU/L (ref 0–32)
AST: 21 IU/L (ref 0–40)
Albumin/Globulin Ratio: 1.4 (ref 1.2–2.2)
Albumin: 4.6 g/dL (ref 3.5–5.5)
Alkaline Phosphatase: 70 IU/L (ref 39–117)
BUN/Creatinine Ratio: 16 (ref 9–23)
BUN: 12 mg/dL (ref 6–24)
Bilirubin Total: 0.5 mg/dL (ref 0.0–1.2)
CALCIUM: 9.9 mg/dL (ref 8.7–10.2)
CO2: 22 mmol/L (ref 20–29)
CREATININE: 0.77 mg/dL (ref 0.57–1.00)
Chloride: 103 mmol/L (ref 96–106)
GFR calc Af Amer: 109 mL/min/{1.73_m2} (ref >59)
GFR, EST NON AFRICAN AMERICAN: 95 mL/min/{1.73_m2} (ref >59)
GLUCOSE: 86 mg/dL (ref 65–99)
Globulin, Total: 3.4 g/dL (ref 1.5–4.5)
Potassium: 5 mmol/L (ref 3.5–5.2)
Sodium: 141 mmol/L (ref 134–144)
Total Protein: 8 g/dL (ref 6.0–8.5)

## 2018-01-07 LAB — LIPID PANEL
CHOL/HDL RATIO: 4 ratio (ref 0.0–4.4)
CHOLESTEROL TOTAL: 259 mg/dL — AB (ref 100–199)
HDL: 65 mg/dL (ref >39)
LDL Calculated: 170 mg/dL — ABNORMAL HIGH (ref 0–99)
TRIGLYCERIDES: 120 mg/dL (ref 0–149)
VLDL Cholesterol Cal: 24 mg/dL (ref 5–40)

## 2018-01-07 LAB — CBC WITH DIFFERENTIAL/PLATELET
Basophils Absolute: 0 10*3/uL (ref 0.0–0.2)
Basos: 0 %
EOS (ABSOLUTE): 0.5 10*3/uL — AB (ref 0.0–0.4)
EOS: 5 %
Hematocrit: 44.4 % (ref 34.0–46.6)
Hemoglobin: 14.7 g/dL (ref 11.1–15.9)
IMMATURE GRANULOCYTES: 0 %
Immature Grans (Abs): 0 10*3/uL (ref 0.0–0.1)
Lymphocytes Absolute: 1.9 10*3/uL (ref 0.7–3.1)
Lymphs: 22 %
MCH: 31.1 pg (ref 26.6–33.0)
MCHC: 33.1 g/dL (ref 31.5–35.7)
MCV: 94 fL (ref 79–97)
MONOS ABS: 0.6 10*3/uL (ref 0.1–0.9)
Monocytes: 7 %
NEUTROS PCT: 66 %
Neutrophils Absolute: 5.8 10*3/uL (ref 1.4–7.0)
PLATELETS: 339 10*3/uL (ref 150–450)
RBC: 4.73 x10E6/uL (ref 3.77–5.28)
RDW: 14.4 % (ref 12.3–15.4)
WBC: 8.8 10*3/uL (ref 3.4–10.8)

## 2018-01-07 LAB — THYROID PANEL WITH TSH
Free Thyroxine Index: 1.6 (ref 1.2–4.9)
T3 UPTAKE RATIO: 23 % — AB (ref 24–39)
T4 TOTAL: 7 ug/dL (ref 4.5–12.0)
TSH: 1.3 u[IU]/mL (ref 0.450–4.500)

## 2018-02-04 ENCOUNTER — Ambulatory Visit: Payer: Managed Care, Other (non HMO) | Admitting: Emergency Medicine

## 2019-04-07 ENCOUNTER — Emergency Department (HOSPITAL_COMMUNITY)
Admission: EM | Admit: 2019-04-07 | Discharge: 2019-04-08 | Disposition: A | Discharge status: Home / Self Care | Payer: 59 | Attending: Emergency Medicine | Admitting: Emergency Medicine

## 2019-04-07 ENCOUNTER — Encounter (HOSPITAL_COMMUNITY): Payer: Self-pay

## 2019-04-07 ENCOUNTER — Other Ambulatory Visit: Payer: Self-pay

## 2019-04-07 ENCOUNTER — Emergency Department (HOSPITAL_COMMUNITY): Payer: 59

## 2019-04-07 DIAGNOSIS — R112 Nausea with vomiting, unspecified: Secondary | ICD-10-CM | POA: Diagnosis not present

## 2019-04-07 DIAGNOSIS — I10 Essential (primary) hypertension: Secondary | ICD-10-CM

## 2019-04-07 DIAGNOSIS — R079 Chest pain, unspecified: Secondary | ICD-10-CM | POA: Diagnosis not present

## 2019-04-07 DIAGNOSIS — R1013 Epigastric pain: Secondary | ICD-10-CM | POA: Diagnosis not present

## 2019-04-07 DIAGNOSIS — R000 Tachycardia, unspecified: Secondary | ICD-10-CM | POA: Diagnosis not present

## 2019-04-07 DIAGNOSIS — J45909 Unspecified asthma, uncomplicated: Secondary | ICD-10-CM | POA: Diagnosis not present

## 2019-04-07 DIAGNOSIS — R1110 Vomiting, unspecified: Secondary | ICD-10-CM | POA: Diagnosis not present

## 2019-04-07 LAB — CBC
HCT: 46 % (ref 36.0–46.0)
Hemoglobin: 15.3 g/dL — ABNORMAL HIGH (ref 12.0–15.0)
MCH: 31.2 pg (ref 26.0–34.0)
MCHC: 33.3 g/dL (ref 30.0–36.0)
MCV: 93.9 fL (ref 80.0–100.0)
Platelets: 238 10*3/uL (ref 150–400)
RBC: 4.9 MIL/uL (ref 3.87–5.11)
RDW: 13.8 % (ref 11.5–15.5)
WBC: 6.1 10*3/uL (ref 4.0–10.5)
nRBC: 0 % (ref 0.0–0.2)

## 2019-04-07 LAB — BASIC METABOLIC PANEL
Anion gap: 17 — ABNORMAL HIGH (ref 5–15)
BUN: 6 mg/dL (ref 6–20)
CO2: 21 mmol/L — ABNORMAL LOW (ref 22–32)
Calcium: 9.6 mg/dL (ref 8.9–10.3)
Chloride: 102 mmol/L (ref 98–111)
Creatinine, Ser: 0.68 mg/dL (ref 0.44–1.00)
GFR calc Af Amer: >60 mL/min (ref >60)
GFR calc non Af Amer: >60 mL/min (ref >60)
Glucose, Bld: 107 mg/dL — ABNORMAL HIGH (ref 70–99)
Potassium: 3.9 mmol/L (ref 3.5–5.1)
Sodium: 140 mmol/L (ref 135–145)

## 2019-04-07 LAB — TROPONIN I (HIGH SENSITIVITY): Troponin I (High Sensitivity): 4 ng/L (ref <18)

## 2019-04-07 LAB — I-STAT BETA HCG BLOOD, ED (MC, WL, AP ONLY): I-stat hCG, quantitative: <5 m[IU]/mL (ref <5)

## 2019-04-07 MED ORDER — SODIUM CHLORIDE 0.9% FLUSH: 3.0000 mL | Freq: Once | INTRAVENOUS | Status: DC

## 2019-04-07 NOTE — ED Triage Notes (Signed)
Pt presents with mid-sternum chest pressure starting today associated with N/V for a while.

## 2019-04-07 NOTE — ED Notes (Signed)
Patient transported to X-ray 

## 2019-04-08 ENCOUNTER — Emergency Department (HOSPITAL_COMMUNITY): Payer: 59

## 2019-04-08 DIAGNOSIS — R112 Nausea with vomiting, unspecified: Secondary | ICD-10-CM | POA: Diagnosis not present

## 2019-04-08 DIAGNOSIS — R1110 Vomiting, unspecified: Secondary | ICD-10-CM | POA: Diagnosis not present

## 2019-04-08 DIAGNOSIS — J45909 Unspecified asthma, uncomplicated: Secondary | ICD-10-CM | POA: Diagnosis not present

## 2019-04-08 DIAGNOSIS — R1013 Epigastric pain: Secondary | ICD-10-CM | POA: Diagnosis not present

## 2019-04-08 LAB — HEPATIC FUNCTION PANEL
ALT: 108 U/L — ABNORMAL HIGH (ref 0–44)
AST: 140 U/L — ABNORMAL HIGH (ref 15–41)
Albumin: 4.5 g/dL (ref 3.5–5.0)
Alkaline Phosphatase: 74 U/L (ref 38–126)
Bilirubin, Direct: 0.1 mg/dL (ref 0.0–0.2)
Indirect Bilirubin: 0.6 mg/dL (ref 0.3–0.9)
Total Bilirubin: 0.7 mg/dL (ref 0.3–1.2)
Total Protein: 8.4 g/dL — ABNORMAL HIGH (ref 6.5–8.1)

## 2019-04-08 LAB — URINALYSIS, ROUTINE W REFLEX MICROSCOPIC
Bilirubin Urine: NEGATIVE
Glucose, UA: NEGATIVE mg/dL
Hgb urine dipstick: NEGATIVE
Ketones, ur: NEGATIVE mg/dL
Leukocytes,Ua: NEGATIVE
Nitrite: NEGATIVE
Protein, ur: NEGATIVE mg/dL
Specific Gravity, Urine: 1.003 — ABNORMAL LOW (ref 1.005–1.030)
pH: 7 (ref 5.0–8.0)

## 2019-04-08 LAB — RAPID URINE DRUG SCREEN, HOSP PERFORMED
Amphetamines: NOT DETECTED
Barbiturates: NOT DETECTED
Benzodiazepines: NOT DETECTED
Cocaine: NOT DETECTED
Opiates: NOT DETECTED
Tetrahydrocannabinol: NOT DETECTED

## 2019-04-08 LAB — LIPASE, BLOOD: Lipase: 156 U/L — ABNORMAL HIGH (ref 11–51)

## 2019-04-08 LAB — TROPONIN I (HIGH SENSITIVITY): Troponin I (High Sensitivity): 4 ng/L (ref <18)

## 2019-04-08 MED ORDER — PROPRANOLOL HCL 40 MG PO TABS: 80.0000 mg | ORAL_TABLET | Freq: Two times a day (BID) | ORAL | 0 refills | Status: DC

## 2019-04-08 MED ORDER — ALUM & MAG HYDROXIDE-SIMETH 200-200-20 MG/5ML PO SUSP
30.0000 mL | Freq: Once | ORAL | Status: AC
  Administered 2019-04-08: 30 mL via ORAL
  Filled 2019-04-08: qty 30

## 2019-04-08 MED ORDER — ONDANSETRON HCL 4 MG/2ML IJ SOLN
4.0000 mg | Freq: Once | INTRAMUSCULAR | Status: AC
  Administered 2019-04-08: 07:00:00 4 mg via INTRAVENOUS
  Filled 2019-04-08: qty 2

## 2019-04-08 MED ORDER — ONDANSETRON HCL 4 MG/2ML IJ SOLN
4.0000 mg | Freq: Once | INTRAMUSCULAR | Status: AC
  Administered 2019-04-08: 10:00:00 4 mg via INTRAVENOUS
  Filled 2019-04-08: qty 2

## 2019-04-08 MED ORDER — METOCLOPRAMIDE HCL 5 MG/ML IJ SOLN
10.0000 mg | Freq: Once | INTRAMUSCULAR | Status: AC
  Administered 2019-04-08: 10 mg via INTRAVENOUS
  Filled 2019-04-08: qty 2

## 2019-04-08 MED ORDER — DIPHENHYDRAMINE HCL 50 MG/ML IJ SOLN
25.0000 mg | Freq: Once | INTRAMUSCULAR | Status: AC
  Administered 2019-04-08: 07:00:00 25 mg via INTRAVENOUS
  Filled 2019-04-08: qty 1

## 2019-04-08 MED ORDER — LIDOCAINE VISCOUS HCL 2 % MT SOLN
15.0000 mL | Freq: Once | OROMUCOSAL | Status: AC
  Administered 2019-04-08: 07:00:00 15 mL via ORAL
  Filled 2019-04-08: qty 15

## 2019-04-08 MED ORDER — OMEPRAZOLE 20 MG PO CPDR: 20.0000 mg | DELAYED_RELEASE_CAPSULE | Freq: Every day | ORAL | 0 refills | Status: DC

## 2019-04-08 MED ORDER — FAMOTIDINE IN NACL 20-0.9 MG/50ML-% IV SOLN
20.0000 mg | Freq: Once | INTRAVENOUS | Status: AC
  Administered 2019-04-08: 07:00:00 20 mg via INTRAVENOUS
  Filled 2019-04-08: qty 50

## 2019-04-08 MED ORDER — IOHEXOL 300 MG/ML  SOLN
100.0000 mL | Freq: Once | INTRAMUSCULAR | Status: AC
  Administered 2019-04-08: 100 mL via INTRAVENOUS

## 2019-04-08 MED ORDER — ONDANSETRON 4 MG PO TBDP: 4.0000 mg | ORAL_TABLET | Freq: Three times a day (TID) | ORAL | 0 refills | Status: DC | PRN

## 2019-04-08 MED ORDER — SODIUM CHLORIDE 0.9 % IV BOLUS
1000.0000 mL | Freq: Once | INTRAVENOUS | Status: AC
  Administered 2019-04-08: 07:00:00 1000 mL via INTRAVENOUS

## 2019-04-08 MED FILL — PROPRANOLOL 40 MG TABLET: 40 | 30 days supply | Qty: 120 | Fill #0

## 2019-04-08 MED FILL — ONDANSETRON ODT 4 MG TABLET: 4 | 3 days supply | Qty: 10 | Fill #0

## 2019-04-08 MED FILL — OMEPRAZOLE 20 MG CAP: 20 | 30 days supply | Qty: 30 | Fill #0

## 2019-04-08 NOTE — ED Provider Notes (Signed)
[facility] EMERGENCY DEPARTMENT Provider Note   CSN: [id number] Arrival date & time: 04/07/19  2132     History   Chief Complaint Chief Complaint  Patient presents with   Chest Pain    HPI Patricia Liu is a 43 y.o. female.     Patient presents to the emergency department for evaluation of nausea, vomiting and abdominal pain.  She reports that these symptoms have been ongoing for months.  She says that she has been trying to ignore it, has not had any work-ups for it because she lost her health insurance.  She has not identified any factors that cause the symptoms.  She says that the pain, nausea and vomiting are present every day.  It does not appear that food worsens the symptoms.  She has not been trying any medications, is unaware of anything that improves her symptoms.  She has not had any hematemesis.  No diarrhea or constipation.     Past Medical History:  Diagnosis Date   Asthma     Patient Active Problem List   Diagnosis Date Noted   Essential hypertension 01/06/2018   Situational anxiety 01/06/2018    Past Surgical History:  Procedure Laterality Date   HIP ARTHROSCOPY W/ LABRAL REPAIR  1999   wisdom tooth  03/2011     OB History    Gravida  1   Para  1   Term  1   Preterm  0   AB  0   Living  1     SAB  0   TAB  0   Ectopic  0   Multiple  0   Live Births  1            Home Medications    Prior to Admission medications   Medication Sig Start Date End Date Taking? Authorizing Provider  albuterol (PROVENTIL HFA;VENTOLIN HFA) 108 (90 BASE) MCG/ACT inhaler Inhale 2 puffs into the lungs every 6 (six) hours as needed.    [provider]  propranolol (INDERAL) 40 MG tablet Take 1 tablet (40 mg total) by mouth 2 (two) times daily. 01/06/18 04/06/18  Horald Pollen, MD    Family History Family History  Problem Relation Age of Onset   Cancer Paternal Grandfather     Social History Social  History   Tobacco Use   Smoking status: Never Smoker   Smokeless tobacco: Never Used  Substance Use Topics   Alcohol use: No   Drug use: No     Allergies   Zosyn [piperacillin sod-tazobactam so] and Sulfa antibiotics   Review of Systems Review of Systems  Gastrointestinal: Positive for abdominal pain, nausea and vomiting.  All other systems reviewed and are negative.    Physical Exam Updated Vital Signs BP (!) 140/100    Pulse 96    Temp 98.3 F (36.8 C) (Oral)    Resp 20    Ht 5\' 5"  (1.651 m)    Wt 65.8 kg    SpO2 97%    BMI 24.13 kg/m   Physical Exam Vitals signs and nursing note reviewed.  Constitutional:      General: She is not in acute distress.    Appearance: Normal appearance. She is well-developed.  HENT:     Head: Normocephalic and atraumatic.     Right Ear: Hearing normal.     Left Ear: Hearing normal.     Nose: Nose normal.  Eyes:     Conjunctiva/sclera: Conjunctivae  normal.     Pupils: Pupils are equal, round, and reactive to light.  Neck:     Musculoskeletal: Normal range of motion and neck supple.  Cardiovascular:     Rate and Rhythm: Regular rhythm.     Heart sounds: S1 normal and S2 normal. No murmur. No friction rub. No gallop.   Pulmonary:     Effort: Pulmonary effort is normal. No respiratory distress.     Breath sounds: Normal breath sounds.  Chest:     Chest wall: No tenderness.  Abdominal:     General: Bowel sounds are normal.     Palpations: Abdomen is soft.     Tenderness: There is abdominal tenderness in the epigastric area. There is no guarding or rebound. Negative signs include Murphy's sign and McBurney's sign.     Hernia: No hernia is present.  Musculoskeletal: Normal range of motion.  Skin:    General: Skin is warm and dry.     Findings: No rash.  Neurological:     Mental Status: She is alert and oriented to person, place, and time.     GCS: GCS eye subscore is 4. GCS verbal subscore is 5. GCS motor subscore is 6.      Cranial Nerves: No cranial nerve deficit.     Sensory: No sensory deficit.     Coordination: Coordination normal.  Psychiatric:        Speech: Speech normal.        Behavior: Behavior normal.        Thought Content: Thought content normal.      ED Treatments / Results  Labs (all labs ordered are listed, but only abnormal results are displayed) Labs Reviewed  BASIC METABOLIC PANEL - Abnormal; Notable for the following components:      Result Value   CO2 21 (*)    Glucose, Bld 107 (*)    Anion gap 17 (*)    All other components within normal limits  CBC - Abnormal; Notable for the following components:   Hemoglobin 15.3 (*)    All other components within normal limits  HEPATIC FUNCTION PANEL  LIPASE, BLOOD  RAPID URINE DRUG SCREEN, HOSP PERFORMED  URINALYSIS, ROUTINE W REFLEX MICROSCOPIC  I-STAT BETA HCG BLOOD, ED ([facility], [facility], [facility] ONLY)  TROPONIN I (HIGH SENSITIVITY)  TROPONIN I (HIGH SENSITIVITY)    EKG EKG Interpretation  Date/Time:  Wednesday April 07 2019 21:43:30 EDT Ventricular Rate:  106 PR Interval:  126 QRS Duration: 72 QT Interval:  316 QTC Calculation: 419 R Axis:   15 Text Interpretation:  Sinus tachycardia Otherwise normal ECG Confirmed by Gilda Crease [phone]) on 04/08/2019 5:47:22 AM   Radiology Dg Chest 2 View  Result Date: 04/07/2019 CLINICAL DATA:  Chest pain EXAM: CHEST - 2 VIEW COMPARISON:  None. FINDINGS: The heart size and mediastinal contours are within normal limits. Both lungs are clear. The visualized skeletal structures are unremarkable. IMPRESSION: No active cardiopulmonary disease. Electronically Signed   By: Alcide Clever M.D.   On: 04/07/2019 22:11    Procedures Procedures (including critical care time)  Medications Ordered in ED Medications  sodium chloride flush (NS) 0.9 % injection 3 mL (has no administration in time range)  sodium chloride 0.9 % bolus 1,000 mL (has no administration in time range)  ondansetron (ZOFRAN)  injection 4 mg (has no administration in time range)  metoCLOPramide (REGLAN) injection 10 mg (has no administration in time range)  diphenhydrAMINE (BENADRYL) injection 25 mg (has no administration  in time range)  famotidine (PEPCID) IVPB 20 mg premix (has no administration in time range)  alum & mag hydroxide-simeth (MAALOX/MYLANTA) 200-200-20 MG/5ML suspension 30 mL (has no administration in time range)    And  lidocaine (XYLOCAINE) 2 % viscous mouth solution 15 mL (has no administration in time range)     Initial Impression / Assessment and Plan / ED Course  I have reviewed the triage vital signs and the nursing notes.  Pertinent labs & imaging results that were available during my care of the patient were reviewed by me and considered in my medical decision making (see chart for details).        Patient presents to the emergency department for evaluation of abdominal pain associated with nausea and vomiting that has been ongoing somewhat chronically for several months.  Patient has abdominal fullness and distention with pain in the center of her upper abdomen.  She has not identified alleviating or exacerbating factors.  Examination reveals tenderness in the epigastric region but there is no right upper quadrant tenderness.  Based on the chronicity as well as her exam, doubt gallbladder disease.  Will hydrate and treat nausea as well as possible gastritis/ulcer symptomatically.  Will undergo CT scan.  Will sign out to oncoming ER physician to follow-up on CT scan as well as response to medications.  Will likely need outpatient GI follow-up.  Final Clinical Impressions(s) / ED Diagnoses   Final diagnoses:  Epigastric pain  Nausea and vomiting, intractability of vomiting not specified, unspecified vomiting type    ED Discharge Orders    None       Gilda CreasePollina, Vikram Tillett J, MD 04/08/19 [phone]

## 2019-04-08 NOTE — ED Provider Notes (Signed)
Pt signed out by Dr. Betsey Holiday pending CT results:  IMPRESSION:  1. 1 mm nonobstructing calculus upper pole left kidney. No  hydronephrosis or ureteral calculus on either side. Urinary bladder  wall thickness normal.    2. No bowel obstruction. No abscess in the abdomen or pelvis.  Appendix appears normal.    3. Diffuse hepatic steatosis.    4. Small hiatal hernia.    5. Intrauterine device positioned within the endometrium.   Pt will be d/c home with instructions to f/u with GI.  She is d/c home with prilosec and zofran.  She is told to return if worse.  Rx for her Inderal is also refilled.     Isla Pence, MD 04/08/19 [phone]

## 2019-04-08 NOTE — ED Notes (Signed)
Pt complaining of abdominal pain. Updated pt on wait time and how soon she might go back.

## 2019-04-08 NOTE — ED Notes (Signed)
Patient verbalizes understanding of discharge instructions. Opportunity for questioning and answers were provided. Armband removed by staff, pt discharged from ED.  

## 2019-04-26 MED FILL — PRAZOSIN HCL 1 MG CAPS: 1 | 30 days supply | Qty: 30 | Fill #0

## 2019-05-13 ENCOUNTER — Ambulatory Visit (HOSPITAL_COMMUNITY)
Admission: EM | Admit: 2019-05-13 | Discharge: 2019-05-13 | Disposition: A | Discharge status: Home / Self Care | Payer: 59 | Source: Home / Self Care

## 2019-05-13 ENCOUNTER — Encounter (HOSPITAL_COMMUNITY): Payer: Self-pay | Admitting: Emergency Medicine

## 2019-05-13 ENCOUNTER — Emergency Department (HOSPITAL_COMMUNITY)
Admission: EM | Admit: 2019-05-13 | Discharge: 2019-05-15 | Disposition: A | Discharge status: Other Acute Inpatient Hospital | Payer: 59 | Attending: Emergency Medicine | Admitting: Emergency Medicine

## 2019-05-13 ENCOUNTER — Other Ambulatory Visit: Payer: Self-pay

## 2019-05-13 DIAGNOSIS — E860 Dehydration: Secondary | ICD-10-CM | POA: Insufficient documentation

## 2019-05-13 DIAGNOSIS — R441 Visual hallucinations: Secondary | ICD-10-CM | POA: Diagnosis not present

## 2019-05-13 DIAGNOSIS — Z046 Encounter for general psychiatric examination, requested by authority: Secondary | ICD-10-CM | POA: Diagnosis not present

## 2019-05-13 DIAGNOSIS — I10 Essential (primary) hypertension: Secondary | ICD-10-CM | POA: Insufficient documentation

## 2019-05-13 DIAGNOSIS — G4700 Insomnia, unspecified: Secondary | ICD-10-CM | POA: Diagnosis not present

## 2019-05-13 DIAGNOSIS — R440 Auditory hallucinations: Secondary | ICD-10-CM | POA: Diagnosis not present

## 2019-05-13 DIAGNOSIS — Z20828 Contact with and (suspected) exposure to other viral communicable diseases: Secondary | ICD-10-CM | POA: Diagnosis not present

## 2019-05-13 DIAGNOSIS — S0990XA Unspecified injury of head, initial encounter: Secondary | ICD-10-CM | POA: Diagnosis not present

## 2019-05-13 DIAGNOSIS — J45909 Unspecified asthma, uncomplicated: Secondary | ICD-10-CM | POA: Diagnosis not present

## 2019-05-13 DIAGNOSIS — N179 Acute kidney failure, unspecified: Secondary | ICD-10-CM | POA: Insufficient documentation

## 2019-05-13 DIAGNOSIS — F22 Delusional disorders: Secondary | ICD-10-CM | POA: Diagnosis present

## 2019-05-13 DIAGNOSIS — R443 Hallucinations, unspecified: Secondary | ICD-10-CM | POA: Diagnosis not present

## 2019-05-13 DIAGNOSIS — R410 Disorientation, unspecified: Secondary | ICD-10-CM | POA: Insufficient documentation

## 2019-05-13 DIAGNOSIS — F419 Anxiety disorder, unspecified: Secondary | ICD-10-CM | POA: Diagnosis not present

## 2019-05-13 DIAGNOSIS — F23 Brief psychotic disorder: Secondary | ICD-10-CM | POA: Diagnosis present

## 2019-05-13 DIAGNOSIS — Z79899 Other long term (current) drug therapy: Secondary | ICD-10-CM | POA: Insufficient documentation

## 2019-05-13 DIAGNOSIS — Z03818 Encounter for observation for suspected exposure to other biological agents ruled out: Secondary | ICD-10-CM | POA: Diagnosis not present

## 2019-05-13 LAB — RAPID URINE DRUG SCREEN, HOSP PERFORMED
Amphetamines: NOT DETECTED
Barbiturates: NOT DETECTED
Benzodiazepines: NOT DETECTED
Cocaine: NOT DETECTED
Opiates: NOT DETECTED
Tetrahydrocannabinol: NOT DETECTED

## 2019-05-13 LAB — CBC
HCT: 41.2 % (ref 36.0–46.0)
Hemoglobin: 13.3 g/dL (ref 12.0–15.0)
MCH: 33.2 pg (ref 26.0–34.0)
MCHC: 32.3 g/dL (ref 30.0–36.0)
MCV: 102.7 fL — ABNORMAL HIGH (ref 80.0–100.0)
Platelets: 260 10*3/uL (ref 150–400)
RBC: 4.01 MIL/uL (ref 3.87–5.11)
RDW: 16.3 % — ABNORMAL HIGH (ref 11.5–15.5)
WBC: 12.9 10*3/uL — ABNORMAL HIGH (ref 4.0–10.5)
nRBC: 0 % (ref 0.0–0.2)

## 2019-05-13 LAB — COMPREHENSIVE METABOLIC PANEL
ALT: 33 U/L (ref 0–44)
AST: 57 U/L — ABNORMAL HIGH (ref 15–41)
Albumin: 4.6 g/dL (ref 3.5–5.0)
Alkaline Phosphatase: 65 U/L (ref 38–126)
Anion gap: 20 — ABNORMAL HIGH (ref 5–15)
BUN: 45 mg/dL — ABNORMAL HIGH (ref 6–20)
CO2: 14 mmol/L — ABNORMAL LOW (ref 22–32)
Calcium: 9.4 mg/dL (ref 8.9–10.3)
Chloride: 106 mmol/L (ref 98–111)
Creatinine, Ser: 2.17 mg/dL — ABNORMAL HIGH (ref 0.44–1.00)
GFR calc Af Amer: 31 mL/min — ABNORMAL LOW (ref >60)
GFR calc non Af Amer: 27 mL/min — ABNORMAL LOW (ref >60)
Glucose, Bld: 73 mg/dL (ref 70–99)
Potassium: 3.9 mmol/L (ref 3.5–5.1)
Sodium: 140 mmol/L (ref 135–145)
Total Bilirubin: 1.9 mg/dL — ABNORMAL HIGH (ref 0.3–1.2)
Total Protein: 8.1 g/dL (ref 6.5–8.1)

## 2019-05-13 LAB — ACETAMINOPHEN LEVEL: Acetaminophen (Tylenol), Serum: <10 ug/mL — ABNORMAL LOW (ref 10–30)

## 2019-05-13 LAB — I-STAT BETA HCG BLOOD, ED (MC, WL, AP ONLY): I-stat hCG, quantitative: <5 m[IU]/mL (ref <5)

## 2019-05-13 LAB — SALICYLATE LEVEL: Salicylate Lvl: <7 mg/dL (ref 2.8–30.0)

## 2019-05-13 LAB — ETHANOL: Alcohol, Ethyl (B): <10 mg/dL (ref <10)

## 2019-05-13 NOTE — ED Notes (Signed)
PT up out of chair messing with EKG machine in hallway. PT was redirected to chair and asked not to roam halls and stay in the recliner.

## 2019-05-13 NOTE — ED Notes (Signed)
[place] pts Mother, wants an update on pt

## 2019-05-13 NOTE — ED Triage Notes (Signed)
Pt presents with hallucinations that are not telling her anything. Pt states they are normal voices and when she turns around there is nobody there. Pt also reports that she had a thread worm drop in her eye and now she has bugs crawling under her skin. Pt reports she was able to pick several out of her skin and put them in a bag however they dried up and they are not visible anymore. Pt reports this is the first time she has heard these voices.

## 2019-05-14 ENCOUNTER — Encounter (HOSPITAL_COMMUNITY): Payer: Self-pay | Admitting: Radiology

## 2019-05-14 ENCOUNTER — Emergency Department (HOSPITAL_COMMUNITY): Payer: 59

## 2019-05-14 DIAGNOSIS — Z20828 Contact with and (suspected) exposure to other viral communicable diseases: Secondary | ICD-10-CM | POA: Diagnosis not present

## 2019-05-14 DIAGNOSIS — E860 Dehydration: Secondary | ICD-10-CM | POA: Diagnosis not present

## 2019-05-14 DIAGNOSIS — R441 Visual hallucinations: Secondary | ICD-10-CM | POA: Diagnosis not present

## 2019-05-14 DIAGNOSIS — R410 Disorientation, unspecified: Secondary | ICD-10-CM | POA: Diagnosis not present

## 2019-05-14 DIAGNOSIS — R443 Hallucinations, unspecified: Secondary | ICD-10-CM | POA: Diagnosis not present

## 2019-05-14 DIAGNOSIS — F419 Anxiety disorder, unspecified: Secondary | ICD-10-CM | POA: Diagnosis not present

## 2019-05-14 DIAGNOSIS — G4700 Insomnia, unspecified: Secondary | ICD-10-CM | POA: Diagnosis not present

## 2019-05-14 DIAGNOSIS — S0990XA Unspecified injury of head, initial encounter: Secondary | ICD-10-CM | POA: Diagnosis not present

## 2019-05-14 DIAGNOSIS — N179 Acute kidney failure, unspecified: Secondary | ICD-10-CM | POA: Diagnosis not present

## 2019-05-14 DIAGNOSIS — F23 Brief psychotic disorder: Secondary | ICD-10-CM | POA: Diagnosis present

## 2019-05-14 DIAGNOSIS — J45909 Unspecified asthma, uncomplicated: Secondary | ICD-10-CM | POA: Diagnosis not present

## 2019-05-14 DIAGNOSIS — I10 Essential (primary) hypertension: Secondary | ICD-10-CM | POA: Diagnosis not present

## 2019-05-14 LAB — POCT I-STAT EG7
Acid-base deficit: 6 mmol/L — ABNORMAL HIGH (ref 0.0–2.0)
Bicarbonate: 20.7 mmol/L (ref 20.0–28.0)
Calcium, Ion: 1.24 mmol/L (ref 1.15–1.40)
HCT: 41 % (ref 36.0–46.0)
Hemoglobin: 13.9 g/dL (ref 12.0–15.0)
O2 Saturation: 47 %
Potassium: 3.5 mmol/L (ref 3.5–5.1)
Sodium: 138 mmol/L (ref 135–145)
TCO2: 22 mmol/L (ref 22–32)
pCO2, Ven: 44 mmHg (ref 44.0–60.0)
pH, Ven: 7.281 (ref 7.250–7.430)
pO2, Ven: 29 mmHg — CL (ref 32.0–45.0)

## 2019-05-14 LAB — BASIC METABOLIC PANEL
Anion gap: 14 (ref 5–15)
BUN: 26 mg/dL — ABNORMAL HIGH (ref 6–20)
CO2: 17 mmol/L — ABNORMAL LOW (ref 22–32)
Calcium: 8.8 mg/dL — ABNORMAL LOW (ref 8.9–10.3)
Chloride: 106 mmol/L (ref 98–111)
Creatinine, Ser: 1.14 mg/dL — ABNORMAL HIGH (ref 0.44–1.00)
GFR calc Af Amer: >60 mL/min (ref >60)
GFR calc non Af Amer: 58 mL/min — ABNORMAL LOW (ref >60)
Glucose, Bld: 72 mg/dL (ref 70–99)
Potassium: 3.3 mmol/L — ABNORMAL LOW (ref 3.5–5.1)
Sodium: 137 mmol/L (ref 135–145)

## 2019-05-14 LAB — TSH: TSH: 1.642 u[IU]/mL (ref 0.350–4.500)

## 2019-05-14 LAB — LACTIC ACID, PLASMA: Lactic Acid, Venous: 2.4 mmol/L (ref 0.5–1.9)

## 2019-05-14 LAB — SARS CORONAVIRUS 2 BY RT PCR (HOSPITAL ORDER, PERFORMED IN ~~LOC~~ HOSPITAL LAB): SARS Coronavirus 2: NEGATIVE

## 2019-05-14 MED ORDER — ARIPIPRAZOLE 5 MG PO TABS
5.0000 mg | ORAL_TABLET | Freq: Every day | ORAL | Status: DC
  Administered 2019-05-14: 21:00:00 5 mg via ORAL
  Filled 2019-05-14: qty 1

## 2019-05-14 MED ORDER — SODIUM CHLORIDE 0.9 % IV BOLUS (SEPSIS)
1000.0000 mL | Freq: Once | INTRAVENOUS | Status: AC
  Administered 2019-05-14: 05:00:00 1000 mL via INTRAVENOUS

## 2019-05-14 MED ORDER — LORAZEPAM 2 MG/ML IJ SOLN
1.0000 mg | Freq: Once | INTRAMUSCULAR | Status: AC
  Administered 2019-05-14: 1 mg via INTRAVENOUS
  Filled 2019-05-14: qty 1

## 2019-05-14 MED ORDER — LACTATED RINGERS IV BOLUS
2000.0000 mL | Freq: Once | INTRAVENOUS | Status: AC
  Administered 2019-05-14: 01:00:00 2000 mL via INTRAVENOUS

## 2019-05-14 MED ORDER — LORAZEPAM 0.5 MG PO TABS
0.5000 mg | ORAL_TABLET | Freq: Two times a day (BID) | ORAL | Status: DC
  Administered 2019-05-14 – 2019-05-15 (×2): 0.5 mg via ORAL
  Filled 2019-05-14 (×2): qty 1

## 2019-05-14 MED ORDER — PRAZOSIN HCL 1 MG PO CAPS
1.0000 mg | ORAL_CAPSULE | Freq: Every day | ORAL | Status: DC
  Administered 2019-05-14: 1 mg via ORAL
  Filled 2019-05-14 (×2): qty 1

## 2019-05-14 MED ORDER — POTASSIUM CHLORIDE CRYS ER 20 MEQ PO TBCR
40.0000 meq | EXTENDED_RELEASE_TABLET | Freq: Once | ORAL | Status: AC
  Administered 2019-05-14: 05:00:00 40 meq via ORAL
  Filled 2019-05-14: qty 2

## 2019-05-14 MED ORDER — PANTOPRAZOLE SODIUM 20 MG PO TBEC
20.0000 mg | DELAYED_RELEASE_TABLET | Freq: Two times a day (BID) | ORAL | Status: DC
  Administered 2019-05-14 – 2019-05-15 (×2): 20 mg via ORAL
  Filled 2019-05-14 (×2): qty 1

## 2019-05-14 MED ORDER — PROPRANOLOL HCL 40 MG PO TABS
40.0000 mg | ORAL_TABLET | Freq: Two times a day (BID) | ORAL | Status: DC
  Administered 2019-05-14 – 2019-05-15 (×2): 40 mg via ORAL
  Filled 2019-05-14 (×2): qty 1

## 2019-05-14 NOTE — ED Notes (Signed)
Patient transported to CT 

## 2019-05-14 NOTE — ED Notes (Signed)
EDP at bedside  

## 2019-05-14 NOTE — ED Notes (Signed)
Patient transported to MRI 

## 2019-05-14 NOTE — ED Notes (Signed)
Regular Diet was ordered for Breakfast. 

## 2019-05-14 NOTE — ED Provider Notes (Signed)
  Physical Exam  BP 112/73   Pulse 87   Temp 98.4 F (36.9 C) (Oral)   Resp 16   Ht 5\' 4"  (1.626 m)   Wt 72.6 kg   LMP 04/19/2019   SpO2 98%   BMI 27.46 kg/m   Physical Exam  ED Course/Procedures     Procedures  MDM  Received patient in signout.  Mild acute kidney injury with mental status changes.  Creatinine elevated but now improved with IV fluids.  Think the mental status changes are more the cause of the kidney injury from decreased oral intake as opposed to the dehydration causing the mental status change.  Patient is medically cleared at this time.        Davonna Belling, MD 05/14/19 [phone]

## 2019-05-14 NOTE — ED Provider Notes (Signed)
Blood pressure 117/81, pulse 87, temperature 98.2 F (36.8 C), temperature source Oral, resp. rate 18, height 5\' 4"  (1.626 m), weight 72.6 kg, last menstrual period 04/19/2019, SpO2 98 %.  In short, Patricia Liu is a 43 y.o. female with a chief complaint of Hallucinations and Bugs Crawling under skin .  Refer to the original H&P for additional details.  MRI ordered by TTS after discussion with Neurology.  MRI interpreted with no evidence of acute changes or infarct.  Patient is medically clear at this time for inpatient psychiatric admission.     Margette Fast, MD 05/14/19 2115

## 2019-05-14 NOTE — ED Notes (Addendum)
Pt. Calmer at this time. Pt to continue to rest

## 2019-05-14 NOTE — BH Assessment (Signed)
[facility] Assessment Progress Note   Patient was discussed with Dr. Christy Gentles.  He said that she is almost medically cleared.  He is going to make sure she gets another bag of fluid.  He said that his impression was that she had some mania and had not taken in enough po fluids and had become dehydrated.    We agreed that patient will have an AM psych evaluation to determine if inpatient care is recommended.
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
## 2019-05-14 NOTE — Progress Notes (Signed)
Received call from Neurologist on call about consult. Writer was advised to obtain MRI brain wo contrast, if no acute findings, stroke or abnormal processes are found patient can be accepted inpatient. He stated "neuro consult was not needed for these type of things'. Ordered placed for MRI of brain wo contrast. Patient has been accepted to [facility], pending medical clearance. Also ordered TSH due to psychosis, and hallucinations (tactile, visual, and auditory) in the setting of negative UDS.  Updated daughter and mother of new disposition.

## 2019-05-14 NOTE — ED Notes (Signed)
Pt presents to room with loose association. Pt changes topic frequently from talking about a fall, a posters, bugs crawling, and her daughter. Pt. States "i've always been a little scatter brained." Pt apologized and states "i'm so confused." pt A&Ox4.

## 2019-05-14 NOTE — ED Notes (Signed)
Pt yelling out to staff "someone please help me." When RN entered room, found pt stating by sink in corner holding the curtain around skin. Pt. States she's trying not to get wet that there is something on the other side of the curtain. Pt. Redirect told there is nothing on the other side of the curtain. environment secured at this time. Pt redirect back to bed. Pt anxious and paranoid. Pt cooperative and redirectable.

## 2019-05-14 NOTE — ED Notes (Signed)
Pt making phone call at this time.  Calm, cooperative, A&O x 4. Pt is aware that an MRI has been ordered. Denies claustrophobia, or metal in body.

## 2019-05-14 NOTE — BH Assessment (Signed)
Pt has been accepted to [facility] and can arrive tomorrow at 0900. This information was provided to Ava at [place].  Room: 307 Accepting: Dr. Weber Cooks Attending: Dr. Weber Cooks Call to Report: [phone]

## 2019-05-14 NOTE — ED Notes (Signed)
Spoke w/ Sheran Fava, FNP in regard to pt's home medication.  V/o given for Prazosin 1 mg QHS, Protonix 20 mg bid and Propranolol 40 mg po bid.  Dr. Laverta Baltimore placed order for protonix and propranolol.

## 2019-05-14 NOTE — ED Notes (Signed)
Pt has been accepted to [facility] and can arrive tomorrow at 0900. This information was provided to Ava at [facility].  Room: 307 Accepting: Dr. Clapacs Attending: Dr. Clapacs Call to Report: [phone] 

## 2019-05-14 NOTE — ED Notes (Signed)
Diet was ordered for Lunch. 
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
## 2019-05-14 NOTE — ED Provider Notes (Signed)
[facility] EMERGENCY DEPARTMENT Provider Note   CSN: [id number] Arrival date & time: 05/13/19  1428     History   Chief Complaint Chief Complaint  Patient presents with  . Hallucinations  . Bugs Crawling under skin    HPI Patricia Liu is a 43 y.o. female.     The history is provided by the patient.  Mental Health Problem Presenting symptoms: hallucinations   Degree of incapacity (severity):  Severe Onset quality:  Gradual Timing:  Intermittent Progression:  Worsening Chronicity:  New Context: not alcohol use and not drug abuse   Relieved by:  None tried Worsened by:  Nothing Associated symptoms: anxiety and insomnia   Patient with history of asthma and anxiety presents for hallucinations.  She reports over the past several days she has been hearing things as well as visual hallucinations Patient reports she has never had this before.  She denies any recent drug abuse.  She does drink alcohol socially but not the past several days.  She reports she takes medications for GERD, Inderal, and Sam E for depression.  She has not taken any overdose or suicide attempt  She reports a recent fall and hit the right side of her head.  She also reports that she had a red worm drop in her eye and she feels that she has bugs crawling under her skin  She reports she has had very minimal sleep over the past 7 days Past Medical History:  Diagnosis Date  . Asthma     Patient Active Problem List   Diagnosis Date Noted  . Essential hypertension 01/06/2018  . Situational anxiety 01/06/2018    Past Surgical History:  Procedure Laterality Date  . HIP ARTHROSCOPY W/ LABRAL REPAIR  1999  . wisdom tooth  03/2011     OB History    Gravida  1   Para  1   Term  1   Preterm  0   AB  0   Living  1     SAB  0   TAB  0   Ectopic  0   Multiple  0   Live Births  1            Home Medications    Prior to Admission medications   Medication Sig  Start Date End Date Taking? Authorizing Provider  albuterol (PROVENTIL HFA;VENTOLIN HFA) 108 (90 BASE) MCG/ACT inhaler Inhale 2 puffs into the lungs every 6 (six) hours as needed.    [provider]  ibuprofen (ADVIL) 200 MG tablet Take 200 mg by mouth every 6 (six) hours as needed for headache or moderate pain.    [provider]  omeprazole (PRILOSEC) 20 MG capsule Take 1 capsule (20 mg total) by mouth daily. 04/08/19   Jacalyn Lefevre, MD  ondansetron (ZOFRAN ODT) 4 MG disintegrating tablet Take 1 tablet (4 mg total) by mouth every 8 (eight) hours as needed. 04/08/19   Jacalyn Lefevre, MD  propranolol (INDERAL) 40 MG tablet Take 2 tablets (80 mg total) by mouth 2 (two) times daily. 04/08/19 05/08/19  Jacalyn Lefevre, MD    Family History Family History  Problem Relation Age of Onset  . Cancer Paternal Grandfather     Social History Social History   Tobacco Use  . Smoking status: Never Smoker  . Smokeless tobacco: Never Used  Substance Use Topics  . Alcohol use: No  . Drug use: No     Allergies   Zosyn [piperacillin  sod-tazobactam so] and Sulfa antibiotics   Review of Systems Review of Systems  Constitutional: Negative for fever.  Gastrointestinal: Negative for diarrhea and vomiting.  Psychiatric/Behavioral: Positive for hallucinations and sleep disturbance. The patient is nervous/anxious and has insomnia.   All other systems reviewed and are negative.    Physical Exam Updated Vital Signs BP 116/75   Pulse 84   Temp 98.4 F (36.9 C) (Oral)   Resp 19   Ht 1.626 m (5\' 4" )   Wt 72.6 kg   LMP 04/19/2019   SpO2 99%   BMI 27.46 kg/m   Physical Exam CONSTITUTIONAL: Disheveled and anxious HEAD: Normocephalic/atraumatic EYES: EOMI/PERRL, no nystagmus ENMT: Mucous membranes dry NECK: supple no meningeal signs SPINE/BACK:entire spine nontender CV: S1/S2 noted, no murmurs/rubs/gallops noted LUNGS: Lungs are clear to auscultation bilaterally, mild  tachypnea noted  ABDOMEN: soft, nontender, no rebound or guarding, bowel sounds noted throughout abdomen GU:no cva tenderness NEURO: Pt is awake/alert/appropriate, moves all extremitiesx4.  No facial droop.  She is ambulatory.  No focal weakness.  She answers all questions appropriately EXTREMITIES: pulses normal/equal, full ROM SKIN: warm, color normal PSYCH: Pressured speech noted, somewhat tangential thought process  ED Treatments / Results  Labs (all labs ordered are listed, but only abnormal results are displayed) Labs Reviewed  COMPREHENSIVE METABOLIC PANEL - Abnormal; Notable for the following components:      Result Value   CO2 14 (*)    BUN 45 (*)    Creatinine, Ser 2.17 (*)    AST 57 (*)    Total Bilirubin 1.9 (*)    GFR calc non Af Amer 27 (*)    GFR calc Af Amer 31 (*)    Anion gap 20 (*)    All other components within normal limits  ACETAMINOPHEN LEVEL - Abnormal; Notable for the following components:   Acetaminophen (Tylenol), Serum <10 (*)    All other components within normal limits  CBC - Abnormal; Notable for the following components:   WBC 12.9 (*)    MCV 102.7 (*)    RDW 16.3 (*)    All other components within normal limits  LACTIC ACID, PLASMA - Abnormal; Notable for the following components:   Lactic Acid, Venous 2.4 (*)    All other components within normal limits  BASIC METABOLIC PANEL - Abnormal; Notable for the following components:   Potassium 3.3 (*)    CO2 17 (*)    BUN 26 (*)    Creatinine, Ser 1.14 (*)    Calcium 8.8 (*)    GFR calc non Af Amer 58 (*)    All other components within normal limits  POCT I-STAT EG7 - Abnormal; Notable for the following components:   pO2, Ven 29.0 (*)    Acid-base deficit 6.0 (*)    All other components within normal limits  ETHANOL  SALICYLATE LEVEL  RAPID URINE DRUG SCREEN, HOSP PERFORMED  I-STAT BETA HCG BLOOD, ED ([facility], [facility], [facility] ONLY)    EKG EKG Interpretation  Date/Time:  Friday May 14 2019  01:26:29 EDT Ventricular Rate:  86 PR Interval:    QRS Duration: 88 QT Interval:  372 QTC Calculation: 445 R Axis:   13 Text Interpretation:  Sinus rhythm Abnormal R-wave progression, early transition No significant change since last tracing Interpretation limited secondary to artifact Confirmed by Ripley Fraise [phone]) on 05/14/2019 1:30:00 AM   Radiology Ct Head Wo Contrast  Result Date: 05/14/2019 CLINICAL DATA:  Hallucinations EXAM: CT HEAD WITHOUT CONTRAST TECHNIQUE: Contiguous  axial images were obtained from the base of the skull through the vertex without intravenous contrast. COMPARISON:  None. FINDINGS: Brain: No evidence of acute territorial infarction, hemorrhage, hydrocephalus,extra-axial collection or mass lesion/mass effect. Normal gray-white differentiation. Ventricles are normal in size and contour. Vascular: No hyperdense vessel or unexpected calcification. Skull: The skull is intact. No fracture or focal lesion identified. Sinuses/Orbits: Small maxillary retention cysts on the right. The orbits and globes intact. Other: None IMPRESSION: No acute intracranial abnormality. Electronically Signed   By: Jonna ClarkBindu  Avutu M.D.   On: 05/14/2019 02:50    Procedures .Critical Care Performed by: Zadie RhineWickline, Sulamita Lafountain, MD Authorized by: Zadie RhineWickline, Trejon Duford, MD   Critical care provider statement:    Critical care time (minutes):  45   Critical care start time:  05/14/2019 3:30 AM   Critical care end time:  05/14/2019 4:15 AM   Critical care time was exclusive of:  Separately billable procedures and treating other patients   Critical care was necessary to treat or prevent imminent or life-threatening deterioration of the following conditions:  Dehydration and CNS failure or compromise   Critical care was time spent personally by me on the following activities:  Re-evaluation of patient's condition, pulse oximetry, ordering and review of radiographic studies, ordering and review of laboratory  studies, evaluation of patient's response to treatment, discussions with consultants and examination of patient     Medications Ordered in ED Medications  lactated ringers bolus 2,000 mL (0 mLs Intravenous Stopped 05/14/19 0349)  LORazepam (ATIVAN) injection 1 mg (1 mg Intravenous Given 05/14/19 0128)  sodium chloride 0.9 % bolus 1,000 mL (0 mLs Intravenous Stopped 05/14/19 0629)  potassium chloride SA (KLOR-CON) CR tablet 40 mEq (40 mEq Oral Given 05/14/19 0525)     Initial Impression / Assessment and Plan / ED Course  I have reviewed the triage vital signs and the nursing notes.  Pertinent labs & imaging results that were available during my care of the patient were reviewed by me and considered in my medical decision making (see chart for details).        1:56 AM Patient presents for what appears to be new onset psychosis.  She has never hallucinated before.  She is able to clearly describe the hallucinations but she is otherwise answering questions appropriately.  She denies any overdose attempt or any recent drug abuse.  Her labs reveal significant dehydration as well as anion gap.  She will be given IV fluids and reassess to ensure this is improving.  She does take SAM-e for depression but denies any intentional overdose. CT head to be ordered for new onset hallucinations  I discussed the case with her mother via phone.  She was able provide some details.  Per pharmacy her medications are up-to-date 6:45 AM After IV fluids, patient's anion gap has closed and her dehydration is improving.  I do not feel further evaluation is warranted since labs are improving.  No acidosis on VBG She has been resting comfortably.  CT head does not reveal any space-occupying lesion. At this point I feel patient is medically stable and can be evaluated by psychiatry. I discussed the case with the psychiatry team.  She will have a reassessment in a few hours. Final Clinical Impressions(s) / ED  Diagnoses   Final diagnoses:  Visual hallucinations  Auditory hallucination  Dehydration  AKI (acute kidney injury) [facility](HCC)    ED Discharge Orders    None       Zadie RhineWickline, Merve Hotard, MD 05/14/19 [phone]

## 2019-05-15 ENCOUNTER — Inpatient Hospital Stay
Admission: RE | Admit: 2019-05-15 | Discharge: 2019-05-17 | DRG: 885 | Disposition: A | Discharge status: Home / Self Care | Payer: 59 | Source: Intra-hospital | Attending: Psychiatry | Admitting: Psychiatry

## 2019-05-15 DIAGNOSIS — F418 Other specified anxiety disorders: Secondary | ICD-10-CM | POA: Diagnosis present

## 2019-05-15 DIAGNOSIS — E860 Dehydration: Secondary | ICD-10-CM | POA: Diagnosis not present

## 2019-05-15 DIAGNOSIS — G4700 Insomnia, unspecified: Secondary | ICD-10-CM

## 2019-05-15 DIAGNOSIS — Z20828 Contact with and (suspected) exposure to other viral communicable diseases: Secondary | ICD-10-CM | POA: Diagnosis not present

## 2019-05-15 DIAGNOSIS — I10 Essential (primary) hypertension: Secondary | ICD-10-CM | POA: Diagnosis not present

## 2019-05-15 DIAGNOSIS — Z23 Encounter for immunization: Secondary | ICD-10-CM | POA: Diagnosis not present

## 2019-05-15 DIAGNOSIS — F0781 Postconcussional syndrome: Secondary | ICD-10-CM | POA: Diagnosis present

## 2019-05-15 DIAGNOSIS — R441 Visual hallucinations: Secondary | ICD-10-CM | POA: Diagnosis not present

## 2019-05-15 DIAGNOSIS — F4310 Post-traumatic stress disorder, unspecified: Secondary | ICD-10-CM | POA: Diagnosis present

## 2019-05-15 DIAGNOSIS — E119 Type 2 diabetes mellitus without complications: Secondary | ICD-10-CM | POA: Diagnosis present

## 2019-05-15 DIAGNOSIS — J45909 Unspecified asthma, uncomplicated: Secondary | ICD-10-CM | POA: Diagnosis not present

## 2019-05-15 DIAGNOSIS — F419 Anxiety disorder, unspecified: Secondary | ICD-10-CM | POA: Diagnosis not present

## 2019-05-15 DIAGNOSIS — R440 Auditory hallucinations: Secondary | ICD-10-CM | POA: Diagnosis present

## 2019-05-15 DIAGNOSIS — F23 Brief psychotic disorder: Principal | ICD-10-CM | POA: Diagnosis present

## 2019-05-15 DIAGNOSIS — R410 Disorientation, unspecified: Secondary | ICD-10-CM | POA: Diagnosis not present

## 2019-05-15 DIAGNOSIS — K219 Gastro-esophageal reflux disease without esophagitis: Secondary | ICD-10-CM | POA: Diagnosis present

## 2019-05-15 DIAGNOSIS — F29 Unspecified psychosis not due to a substance or known physiological condition: Secondary | ICD-10-CM | POA: Insufficient documentation

## 2019-05-15 DIAGNOSIS — N179 Acute kidney failure, unspecified: Secondary | ICD-10-CM | POA: Diagnosis not present

## 2019-05-15 MED ORDER — ARIPIPRAZOLE 5 MG PO TABS
5.0000 mg | ORAL_TABLET | Freq: Every day | ORAL | Status: DC
  Administered 2019-05-15 – 2019-05-17 (×3): 5 mg via ORAL
  Filled 2019-05-15 (×3): qty 1

## 2019-05-15 MED ORDER — ALUM & MAG HYDROXIDE-SIMETH 200-200-20 MG/5ML PO SUSP: 30.0000 mL | ORAL | Status: DC | PRN

## 2019-05-15 MED ORDER — ALBUTEROL SULFATE HFA 108 (90 BASE) MCG/ACT IN AERS
2.0000 | INHALATION_SPRAY | Freq: Four times a day (QID) | RESPIRATORY_TRACT | Status: DC | PRN
  Filled 2019-05-15: qty 6.7

## 2019-05-15 MED ORDER — ACETAMINOPHEN 325 MG PO TABS: 650.0000 mg | ORAL_TABLET | Freq: Four times a day (QID) | ORAL | Status: DC | PRN

## 2019-05-15 MED ORDER — HYDROXYZINE HCL 50 MG PO TABS
50.0000 mg | ORAL_TABLET | Freq: Four times a day (QID) | ORAL | Status: DC | PRN
  Administered 2019-05-16: 50 mg via ORAL
  Filled 2019-05-15: qty 1

## 2019-05-15 MED ORDER — TRAZODONE HCL 50 MG PO TABS: 50.0000 mg | ORAL_TABLET | Freq: Every evening | ORAL | Status: DC | PRN

## 2019-05-15 MED ORDER — HYDROXYZINE HCL 25 MG PO TABS: 25.0000 mg | ORAL_TABLET | Freq: Three times a day (TID) | ORAL | Status: DC | PRN

## 2019-05-15 MED ORDER — PANTOPRAZOLE SODIUM 40 MG PO TBEC
40.0000 mg | DELAYED_RELEASE_TABLET | Freq: Every day | ORAL | Status: DC
  Administered 2019-05-16 – 2019-05-17 (×2): 40 mg via ORAL
  Filled 2019-05-15 (×2): qty 1

## 2019-05-15 MED ORDER — PRAZOSIN HCL 1 MG PO CAPS
1.0000 mg | ORAL_CAPSULE | Freq: Every day | ORAL | Status: DC
  Administered 2019-05-15 – 2019-05-16 (×2): 1 mg via ORAL
  Filled 2019-05-15: qty 1

## 2019-05-15 MED ORDER — TRAZODONE HCL 100 MG PO TABS
100.0000 mg | ORAL_TABLET | Freq: Every evening | ORAL | Status: DC | PRN
  Administered 2019-05-16: 100 mg via ORAL
  Filled 2019-05-15: qty 1

## 2019-05-15 MED ORDER — MAGNESIUM HYDROXIDE 400 MG/5ML PO SUSP: 30.0000 mL | Freq: Every day | ORAL | Status: DC | PRN

## 2019-05-15 MED ORDER — PROPRANOLOL HCL 20 MG PO TABS
40.0000 mg | ORAL_TABLET | Freq: Two times a day (BID) | ORAL | Status: DC
  Administered 2019-05-15 – 2019-05-16 (×2): 40 mg via ORAL
  Filled 2019-05-15 (×2): qty 2

## 2019-05-15 NOTE — ED Notes (Signed)
Pt aware waiting for her daughter to bring her belongings and for RN to call back for report.

## 2019-05-15 NOTE — ED Notes (Signed)
Per Marcie Bal, RN, [facility], pt to arrive at 1900. Advised to call transport at 1830 so pt may arrive at 1900. Report has been called earlier to [place].

## 2019-05-15 NOTE — ED Notes (Signed)
Pt verbalized understanding and agreement w/tx plan - accepted to [facility]. Pt signed consent form and is now on phone w/her daughter ensuring she will be able to stay home from college to care for her pets. Daughter bringing pt's clothing to ED prior to pt being transported. Consent form faxed to [facility] - Copy sent to Medical Records - Original placed in envelope for [facility].

## 2019-05-15 NOTE — ED Notes (Signed)
Pt aware of tx plan - will be transported to [facility] at 1830.

## 2019-05-15 NOTE — ED Notes (Signed)
Telpsych being performed.

## 2019-05-15 NOTE — ED Notes (Signed)
Lunch tray ordered 
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
## 2019-05-15 NOTE — ED Provider Notes (Signed)
Vitals:   05/15/19 0650 05/15/19 1814  BP: (!) 120/95 132/88  Pulse: 93 86  Resp: 18 16  Temp: 98.9 F (37.2 C) 98.9 F (37.2 C)  SpO2: 98% 97%    Patient is stable for transfer to accepting facility.  EMTALA completed.  Hemodynamically stable.  Labs reassuring.   Varney Biles, MD 05/15/19 [phone]

## 2019-05-15 NOTE — ED Notes (Signed)
Report called to [facility] - [phone] - who advised will call back when pt may be transported d/t they have a 1:1 at this time. Pt aware of delay.

## 2019-05-15 NOTE — ED Notes (Signed)
Ordered bfast 

## 2019-05-15 NOTE — ED Notes (Signed)
Pt's daughter brought pt's clothing - in labeled belongings bag at nurses' desk. Pt aware.

## 2019-05-15 NOTE — ED Notes (Signed)
Breakfast tray ordered 

## 2019-05-15 NOTE — ED Notes (Signed)
Attempted to call report to [facility] [phone] - RN to return call.

## 2019-05-15 NOTE — ED Notes (Signed)
Pt on phone at nurses' desk. 

## 2019-05-16 ENCOUNTER — Other Ambulatory Visit: Payer: Self-pay

## 2019-05-16 DIAGNOSIS — G4700 Insomnia, unspecified: Secondary | ICD-10-CM

## 2019-05-16 DIAGNOSIS — F23 Brief psychotic disorder: Principal | ICD-10-CM

## 2019-05-16 MED ORDER — INFLUENZA VAC SPLIT QUAD 0.5 ML IM SUSY
0.5000 mL | PREFILLED_SYRINGE | INTRAMUSCULAR | Status: AC
  Administered 2019-05-17: 0.5 mL via INTRAMUSCULAR
  Filled 2019-05-16: qty 0.5

## 2019-05-16 MED ORDER — PROPRANOLOL HCL 20 MG PO TABS
40.0000 mg | ORAL_TABLET | Freq: Four times a day (QID) | ORAL | Status: DC
  Administered 2019-05-16 – 2019-05-17 (×5): 40 mg via ORAL
  Filled 2019-05-16 (×6): qty 2

## 2019-05-16 MED ORDER — IBUPROFEN 600 MG PO TABS
600.0000 mg | ORAL_TABLET | Freq: Four times a day (QID) | ORAL | Status: DC | PRN
  Administered 2019-05-16: 600 mg via ORAL
  Filled 2019-05-16: qty 1

## 2019-05-16 MED ORDER — TEMAZEPAM 15 MG PO CAPS
15.0000 mg | ORAL_CAPSULE | Freq: Every day | ORAL | Status: DC
  Administered 2019-05-16: 15 mg via ORAL
  Filled 2019-05-16: qty 1

## 2019-05-16 NOTE — Tx Team (Signed)
Initial Treatment Plan 05/16/2019 2:10 AM [name] KAJ:[id number]    PATIENT STRESSORS: Marital or family conflict   PATIENT STRENGTHS: Average or above average intelligence Communication skills   PATIENT IDENTIFIED PROBLEMS: Depression  Insomnia                   DISCHARGE CRITERIA:  Improved stabilization in mood, thinking, and/or behavior Verbal commitment to aftercare and medication compliance  PRELIMINARY DISCHARGE PLAN: Return to previous living arrangement Return to previous work or school arrangements  PATIENT/FAMILY INVOLVEMENT: This treatment plan has been presented to and reviewed with the patient, Patricia Liu, and/or family member, .  The patient and family have been given the opportunity to ask questions and make suggestions.  Isabella Bowens, RN 05/16/2019, 2:10 AM
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
## 2019-05-16 NOTE — BHH Suicide Risk Assessment (Signed)
[place] INPATIENT:  Family/Significant Other Suicide Prevention Education  Suicide Prevention Education:  Patient Refusal for Family/Significant Other Suicide Prevention Education: The patient Patricia Liu has refused to provide written consent for family/significant other to be provided Family/Significant Other Suicide Prevention Education during admission and/or prior to discharge.  Physician notified.  Corbet Hanley  CUEBAS-COLON 05/16/2019, 4:45 PM

## 2019-05-16 NOTE — Plan of Care (Signed)
  Problem: Education: Goal: Utilization of techniques to improve thought processes will improve Outcome: Progressing Goal: Knowledge of the prescribed therapeutic regimen will improve Outcome: Progressing   Problem: Coping: Goal: Coping ability will improve Outcome: Progressing Goal: Will verbalize feelings Outcome: Progressing   Problem: Health Behavior/Discharge Planning: Goal: Compliance with therapeutic regimen will improve Outcome: Progressing   Problem: Activity: Goal: Imbalance in normal sleep/wake cycle will improve Outcome: Not Progressing

## 2019-05-16 NOTE — Plan of Care (Signed)
  Problem: Education: Goal: Utilization of techniques to improve thought processes will improve Outcome: Progressing Goal: Knowledge of the prescribed therapeutic regimen will improve Outcome: Progressing  D: Patient has been pleasant, calm and cooperative. Denies SI, HI and AVH.  A: Continue to monitor for safety R: Safety maintained.

## 2019-05-16 NOTE — Progress Notes (Signed)
D: Patient has been pleasant, calm and cooperative. Denies SI, HI and AVH A: Continue to monitor for safety R: Safety maintained. 

## 2019-05-16 NOTE — BHH Group Notes (Signed)
LCSW Group Therapy Note 05/16/2019 1:15pm  Type of Therapy and Topic: Group Therapy: Feelings Around Returning Home & Establishing a Supportive Framework and Supporting Oneself When Supports Not Available  Participation Level: Active  Description of Group:  Patients first processed thoughts and feelings about upcoming discharge. These included fears of upcoming changes, lack of change, new living environments, judgements and expectations from others and overall stigma of mental health issues. The group then discussed the definition of a supportive framework, what that looks and feels like, and how do to discern it from an unhealthy non-supportive network. The group identified different types of supports as well as what to do when your family/friends are less than helpful or unavailable  Therapeutic Goals  1. Patient will identify one healthy supportive network that they can use at discharge. 2. Patient will identify one factor of a supportive framework and how to tell it from an unhealthy network. 3. Patient able to identify one coping skill to use when they do not have positive supports from others. 4. Patient will demonstrate ability to communicate their needs through discussion and/or role plays.  Summary of Patient Progress:  Patient scored her mood at a 7.5 (10 best.) Pt engaged during group session. As patients processed their anxiety about discharge and described healthy supports patient shared she is ready to be discharge.  Patients identified at least one self-care tool they were willing to use after discharge.   Therapeutic Modalities Cognitive Behavioral Therapy Motivational Interviewing   Trason Shifflet  CUEBAS-COLON, LCSW 05/16/2019 10:21 AM

## 2019-05-16 NOTE — BHH Suicide Risk Assessment (Signed)
[facility] Admission Suicide Risk Assessment   Nursing information obtained from:    Demographic factors:    Current Mental Status:    Loss Factors:    Historical Factors:    Risk Reduction Factors:     Total Time spent with patient: 1 hour Principal Problem: Brief psychotic disorder ([place]) Diagnosis:  Principal Problem:   Brief psychotic disorder ([place]) Active Problems:   Situational anxiety   Insomnia  Subjective Data: Patient seen and chart reviewed.  This is a patient with no major past psychiatric disorder who presented several days ago to the emergency room in [place] with auditory hallucinations and delusions that had been worsening over about 2 weeks.  On interview today the patient states that she is now feeling much better.  She denies any current hallucinations.  Denies any psychotic symptoms.  Does not present as delusional.  Patient has not made suicidal statements at any point and she denies any suicidal thoughts whatsoever.  She is cooperative and agreeable to appropriate treatment plan.  Continued Clinical Symptoms:  Alcohol Use Disorder Identification Test Final Score (AUDIT): 4 The "Alcohol Use Disorders Identification Test", Guidelines for Use in Primary Care, Second Edition.  World Pharmacologist [facility]). Score between 0-7:  no or low risk or alcohol related problems. Score between 8-15:  moderate risk of alcohol related problems. Score between 16-19:  high risk of alcohol related problems. Score 20 or above:  warrants further diagnostic evaluation for alcohol dependence and treatment.   CLINICAL FACTORS:   Severe Anxiety and/or Agitation   Musculoskeletal: Strength & Muscle Tone: within normal limits Gait & Station: normal Patient leans: N/A  Psychiatric Specialty Exam: Physical Exam  Nursing note and vitals reviewed. Constitutional: She appears well-developed and well-nourished.  HENT:  Head: Normocephalic and atraumatic.  Eyes: Pupils are equal, round,  and reactive to light. Conjunctivae are normal.  Neck: Normal range of motion.  Cardiovascular: Regular rhythm and normal heart sounds.  Respiratory: Effort normal. No respiratory distress.  GI: Soft.  Musculoskeletal: Normal range of motion.  Neurological: She is alert.  Skin: Skin is warm and dry.  Psychiatric: She has a normal mood and affect. Her speech is normal and behavior is normal. Judgment and thought content normal. Cognition and memory are normal.    Review of Systems  Constitutional: Negative.   HENT: Negative.   Eyes: Negative.   Respiratory: Negative.   Cardiovascular: Negative.   Gastrointestinal: Negative.   Musculoskeletal: Negative.   Skin: Negative.   Neurological: Negative.   Psychiatric/Behavioral: Negative for depression, hallucinations, memory loss, substance abuse and suicidal ideas. The patient is nervous/anxious and has insomnia.     Blood pressure (!) 120/93, pulse (!) 102, temperature 99.1 F (37.3 C), temperature source Oral, resp. rate 18, height 5\' 4"  (1.626 m), weight 67.6 kg, last menstrual period 04/19/2019, SpO2 99 %.Body mass index is 25.58 kg/m.  General Appearance: Fairly Groomed  Eye Contact:  Good  Speech:  Clear and Coherent  Volume:  Normal  Mood:  Euthymic  Affect:  Congruent  Thought Process:  Goal Directed  Orientation:  Full (Time, Place, and Person)  Thought Content:  Logical  Suicidal Thoughts:  No  Homicidal Thoughts:  No  Memory:  Immediate;   Fair Recent;   Fair Remote;   Fair  Judgement:  Fair  Insight:  Fair  Psychomotor Activity:  Normal  Concentration:  Concentration: Fair  Recall:  [employer] of Knowledge:  Fair  Language:  Fair  Akathisia:  No  Handed:  Right  AIMS (if indicated):     Assets:  Desire for Improvement Housing Physical Health Social Support  ADL's:  Intact  Cognition:  WNL  Sleep:  Number of Hours: 2.5      COGNITIVE FEATURES THAT CONTRIBUTE TO RISK:  None    SUICIDE RISK:    Minimal: No identifiable suicidal ideation.  Patients presenting with no risk factors but with morbid ruminations; may be classified as minimal risk based on the severity of the depressive symptoms  PLAN OF CARE: Continue 15-minute checks.  Continue engagement in individual and group therapy.  Ongoing assessment by full treatment team.  Monitor for suicidal ideation.  Make sure we have a solid treatment plan for her prior to discharge  I certify that inpatient services furnished can reasonably be expected to improve the patient's condition.   Mordecai Rasmussen, MD 05/16/2019, 3:21 PM
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
## 2019-05-16 NOTE — Progress Notes (Signed)
Patient presents with sad, flat affect but brightens on approach. Denies SI, HI, AVH. Reports not sleeping well and has not slept in 7 days. Reports trazadone ordered did not help sleep. Patients main goal is to sleep.   Patient visible in [place]. Appropriate interaction with staff and peers.    Encouragement and support offered. Safety checks maintained. Pt receptive and remains safe on unit with q15 min checks.

## 2019-05-17 DIAGNOSIS — F4310 Post-traumatic stress disorder, unspecified: Secondary | ICD-10-CM

## 2019-05-17 DIAGNOSIS — F0781 Postconcussional syndrome: Secondary | ICD-10-CM

## 2019-05-17 MED ORDER — PANTOPRAZOLE SODIUM 40 MG PO TBEC: 40.0000 mg | DELAYED_RELEASE_TABLET | Freq: Every day | ORAL | 1 refills | Status: AC

## 2019-05-17 MED ORDER — PRAZOSIN HCL 1 MG PO CAPS: 1.0000 mg | ORAL_CAPSULE | Freq: Every day | ORAL | 1 refills | Status: AC

## 2019-05-17 MED ORDER — TRAZODONE HCL 100 MG PO TABS: 100.0000 mg | ORAL_TABLET | Freq: Every evening | ORAL | 1 refills | Status: DC | PRN

## 2019-05-17 MED ORDER — TEMAZEPAM 15 MG PO CAPS: 15.0000 mg | ORAL_CAPSULE | Freq: Every day | ORAL | 1 refills | Status: DC

## 2019-05-17 MED ORDER — PROPRANOLOL HCL 40 MG PO TABS: 40.0000 mg | ORAL_TABLET | Freq: Four times a day (QID) | ORAL | 1 refills | Status: AC

## 2019-05-17 MED ORDER — ALBUTEROL SULFATE HFA 108 (90 BASE) MCG/ACT IN AERS: 2.0000 | INHALATION_SPRAY | Freq: Four times a day (QID) | RESPIRATORY_TRACT | 1 refills | Status: AC | PRN

## 2019-05-17 MED ORDER — ARIPIPRAZOLE 5 MG PO TABS: 5.0000 mg | ORAL_TABLET | Freq: Every day | ORAL | 1 refills | Status: AC

## 2019-05-17 NOTE — Progress Notes (Signed)
Recreation Therapy Notes    Date: 05/17/2019  Time: 9:30 am  Location: Craft room  Behavioral response: Appropriate   Intervention Topic: Happiness  Discussion/Intervention:  Group content today was focused on Happiness. The group defined happiness and described where happiness comes from. Individuals identified what makes them happy and how they go about making others happy. Patients expressed things that stop them from being happy and ways they can improve their happiness. The group stated reasons why it is important to be happy. The group participated in the intervention "My Happiness", where they had a chance to identify and express things that make them happy. Clinical Observations/Feedback:  Patient came to group and explained that happiness comes from gratitude. Individual was social with peers and staff while participating in group.  Veora Fonte LRT/CTRS         Daniya Aramburo 05/17/2019 11:49 AM

## 2019-05-17 NOTE — BHH Group Notes (Signed)
Overcoming Obstacles  05/17/2019 1PM  Type of Therapy and Topic:  Group Therapy:  Overcoming Obstacles  Participation Level:  Minimal    Description of Group:    In this group patients will be encouraged to explore what they see as obstacles to their own wellness and recovery. They will be guided to discuss their thoughts, feelings, and behaviors related to these obstacles. The group will process together ways to cope with barriers, with attention given to specific choices patients can make. Each patient will be challenged to identify changes they are motivated to make in order to overcome their obstacles. This group will be process-oriented, with patients participating in exploration of their own experiences as well as giving and receiving support and challenge from other group members.   Therapeutic Goals: 1. Patient will identify personal and current obstacles as they relate to admission. 2. Patient will identify barriers that currently interfere with their wellness or overcoming obstacles.  3. Patient will identify feelings, thought process and behaviors related to these barriers. 4. Patient will identify two changes they are willing to make to overcome these obstacles:      Summary of Patient Progress Pt provided with worksheet on 10 ways to develop resilience. When asked to identify a strength she talked about a challenge and had to be redirected. Pt identified "taking care of others" as her strength. She was open to receiving feedback from group members on healthy coping methods.    Therapeutic Modalities:   Cognitive Behavioral Therapy Solution Focused Therapy Motivational Interviewing Relapse Prevention Therapy    Sanjuana Kava, MSW, LCSW 05/17/2019 1:57 PM
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
## 2019-05-17 NOTE — BHH Counselor (Signed)
CSW made two attempts to schedule appointment at [facility] in [place] but was unsuccessful. CSW transferred to [facility], referral specialist. Clayton left confidential voicemail, awaiting response.
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
## 2019-05-17 NOTE — Progress Notes (Signed)
Patient denies SI/HI, denies A/V hallucinations. Patient verbalizes understanding of discharge instructions, follow up care and prescriptions. Patient given all belongings from [facility] locker. Patient escorted out by staff, transported by family. 

## 2019-05-18 MED FILL — ALBUTEROL SULFATE HFA 108 (: 108 (90 BAS | 25 days supply | Qty: 18 | Fill #0

## 2019-05-18 MED FILL — TEMAZEPAM 15 MG CAPSULE: 15 | 30 days supply | Qty: 30 | Fill #0

## 2019-05-18 MED FILL — ARIPIPRAZOLE 5 MG TABS: 5 | 30 days supply | Qty: 30 | Fill #0

## 2019-05-18 MED FILL — PANTOPRAZOLE SOD DR 40 MG T: 40 | 30 days supply | Qty: 30 | Fill #0

## 2019-05-18 MED FILL — traZODone HCL 100 MG TABS: 100 | 30 days supply | Qty: 30 | Fill #0

## 2019-05-18 MED FILL — PROPRANOLOL 40 MG TABLET: 40 | 30 days supply | Qty: 120 | Fill #0

## 2019-05-20 MED FILL — PRAZOSIN HCL 1 MG CAPS: 1 | 30 days supply | Qty: 30 | Fill #1

## 2019-05-24 DIAGNOSIS — F4312 Post-traumatic stress disorder, chronic: Secondary | ICD-10-CM | POA: Diagnosis not present

## 2019-05-24 DIAGNOSIS — F23 Brief psychotic disorder: Secondary | ICD-10-CM | POA: Diagnosis not present

## 2019-05-24 MED FILL — clonazePAM 1 MG TABS: 1 | 30 days supply | Qty: 30 | Fill #0

## 2019-06-21 DIAGNOSIS — F23 Brief psychotic disorder: Secondary | ICD-10-CM | POA: Diagnosis not present

## 2019-06-21 DIAGNOSIS — F411 Generalized anxiety disorder: Secondary | ICD-10-CM | POA: Diagnosis not present

## 2019-06-21 DIAGNOSIS — F4312 Post-traumatic stress disorder, chronic: Secondary | ICD-10-CM | POA: Diagnosis not present

## 2019-06-21 MED FILL — ARIPIPRAZOLE 5 MG TABS: 5 | 30 days supply | Qty: 30 | Fill #0

## 2019-06-21 MED FILL — traZODone HCL 100 MG TABS: 100 | 30 days supply | Qty: 90 | Fill #0

## 2019-06-21 MED FILL — clonazePAM 1 MG TABS: 1 | 30 days supply | Qty: 30 | Fill #0

## 2019-06-26 MED FILL — ESZOPICLONE 1 MG TABS: 1 | 30 days supply | Qty: 30 | Fill #0

## 2019-07-19 DIAGNOSIS — F4312 Post-traumatic stress disorder, chronic: Secondary | ICD-10-CM | POA: Diagnosis not present

## 2019-07-19 DIAGNOSIS — F23 Brief psychotic disorder: Secondary | ICD-10-CM | POA: Diagnosis not present

## 2019-07-19 DIAGNOSIS — F411 Generalized anxiety disorder: Secondary | ICD-10-CM | POA: Diagnosis not present

## 2019-07-19 MED FILL — clonazePAM 1 MG TABS: 1 | 30 days supply | Qty: 30 | Fill #0

## 2019-07-19 MED FILL — traZODone HCL 100 MG TABS: 100 | 30 days supply | Qty: 90 | Fill #0

## 2019-07-19 MED FILL — ARIPIPRAZOLE 5 MG TABS: 5 | 30 days supply | Qty: 30 | Fill #0

## 2019-08-05 MED FILL — traZODone HCL 100 MG TABS: 100 | 30 days supply | Qty: 90 | Fill #0

## 2019-08-05 MED FILL — ARIPIPRAZOLE 5 MG TABS: 5 | 30 days supply | Qty: 30 | Fill #0

## 2019-08-05 MED FILL — clonazePAM 1 MG TABS: 1 | 30 days supply | Qty: 30 | Fill #0

## 2019-08-05 MED FILL — PROPRANOLOL 40 MG TABLET: 40 | 30 days supply | Qty: 120 | Fill #1

## 2019-08-26 MED FILL — PRAZOSIN HCL 1 MG CAPS: 1 | 30 days supply | Qty: 30 | Fill #2

## 2019-09-08 DIAGNOSIS — F4312 Post-traumatic stress disorder, chronic: Secondary | ICD-10-CM | POA: Diagnosis not present

## 2019-09-08 DIAGNOSIS — F411 Generalized anxiety disorder: Secondary | ICD-10-CM | POA: Diagnosis not present

## 2019-09-08 DIAGNOSIS — F23 Brief psychotic disorder: Secondary | ICD-10-CM | POA: Diagnosis not present

## 2019-09-08 MED FILL — clonazePAM 1 MG TABS: 1 | 30 days supply | Qty: 30 | Fill #0

## 2019-09-08 MED FILL — traZODone HCL 100 MG TABS: 100 | 30 days supply | Qty: 90 | Fill #0

## 2019-09-08 MED FILL — ARIPIPRAZOLE 5 MG TABS: 5 | 30 days supply | Qty: 30 | Fill #0

## 2019-11-10 MED FILL — traZODone HCL 100 MG TABS: 100 | 30 days supply | Qty: 90 | Fill #1

## 2019-11-16 MED FILL — PRAZOSIN HCL 1 MG CAPS: 1 | 30 days supply | Qty: 30 | Fill #0

## 2019-11-16 MED FILL — ARIPIPRAZOLE 5 MG TABS: 5 | 30 days supply | Qty: 30 | Fill #1

## 2019-11-16 MED FILL — clonazePAM 1 MG TABS: 1 | 30 days supply | Qty: 30 | Fill #1

## 2019-12-13 DIAGNOSIS — F4312 Post-traumatic stress disorder, chronic: Secondary | ICD-10-CM | POA: Diagnosis not present

## 2019-12-13 DIAGNOSIS — F411 Generalized anxiety disorder: Secondary | ICD-10-CM | POA: Diagnosis not present

## 2019-12-13 DIAGNOSIS — F23 Brief psychotic disorder: Secondary | ICD-10-CM | POA: Diagnosis not present

## 2019-12-13 MED FILL — PRAZOSIN HCL 1 MG CAPS: 1 | 30 days supply | Qty: 30 | Fill #0

## 2019-12-13 MED FILL — traZODone HCL 100 MG TABS: 100 | 30 days supply | Qty: 90 | Fill #0

## 2019-12-13 MED FILL — PROPRANOLOL 40 MG TABLET: 40 | 30 days supply | Qty: 90 | Fill #0

## 2019-12-13 MED FILL — ARIPIPRAZOLE 5 MG TABS: 5 | 30 days supply | Qty: 30 | Fill #0

## 2019-12-14 MED FILL — CLONAZEPAM 1 MG TABS: 1 | 30 days supply | Qty: 30 | Fill #0

## 2019-12-25 MED FILL — PROPRANOLOL 40 MG TABLET: 40 | 30 days supply | Qty: 90 | Fill #0

## 2019-12-25 MED FILL — PRAZOSIN HCL 1 MG CAPS: 1 | 30 days supply | Qty: 30 | Fill #0

## 2019-12-25 MED FILL — traZODone HCL 100 MG TABS: 100 | 30 days supply | Qty: 90 | Fill #0

## 2019-12-25 MED FILL — ARIPIPRAZOLE 5 MG TABS: 5 | 30 days supply | Qty: 30 | Fill #0

## 2019-12-25 MED FILL — ALBUTEROL SULFATE HFA 108 (: 108 (90 BAS | 25 days supply | Qty: 18 | Fill #1

## 2020-03-07 ENCOUNTER — Other Ambulatory Visit (HOSPITAL_COMMUNITY): Payer: Self-pay | Admitting: Psychiatry

## 2020-03-07 DIAGNOSIS — F411 Generalized anxiety disorder: Secondary | ICD-10-CM | POA: Diagnosis not present

## 2020-03-07 DIAGNOSIS — F23 Brief psychotic disorder: Secondary | ICD-10-CM | POA: Diagnosis not present

## 2020-03-07 DIAGNOSIS — F4312 Post-traumatic stress disorder, chronic: Secondary | ICD-10-CM | POA: Diagnosis not present

## 2020-03-07 MED FILL — buPROPion HCL ER (XL) 150 M: 150 | 30 days supply | Qty: 30 | Fill #0

## 2020-03-07 MED FILL — PROPRANOLOL 40 MG TABLET: 40 | 30 days supply | Qty: 90 | Fill #0

## 2020-03-07 MED FILL — traZODone HCL 100 MG TABS: 100 | 30 days supply | Qty: 90 | Fill #0

## 2020-03-07 MED FILL — CLONAZEPAM 1 MG TABS: 1 | 30 days supply | Qty: 30 | Fill #0

## 2020-03-07 MED FILL — PRAZOSIN HCL 2 MG CAPS: 2 | 30 days supply | Qty: 30 | Fill #0

## 2020-03-30 IMAGING — CT CT HEAD W/O CM
4 series · 17 of 47 positions shown, 19 images · non-contrast
Comparison: None.

CLINICAL DATA: Hallucinations

EXAM:
CT HEAD WITHOUT CONTRAST
TECHNIQUE: Contiguous axial images were obtained from the base of the skull
through the vertex without intravenous contrast.

[Series 3: head without · axial · non-contrast · 0.40mm/px · z∈[-81,+39]mm · 7 of 33 slices shown, 9 images]
[im 5/33  brain]
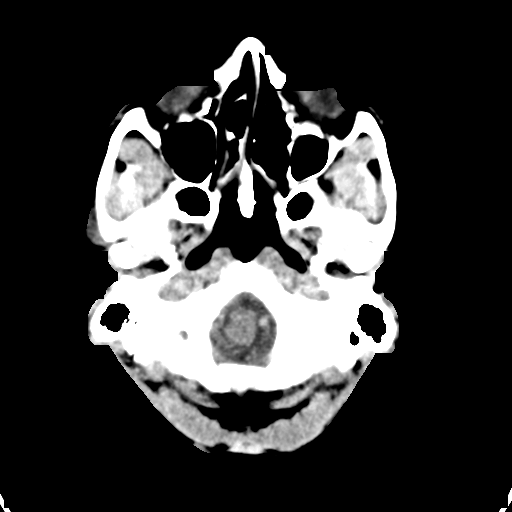
[im 5/33  bone]
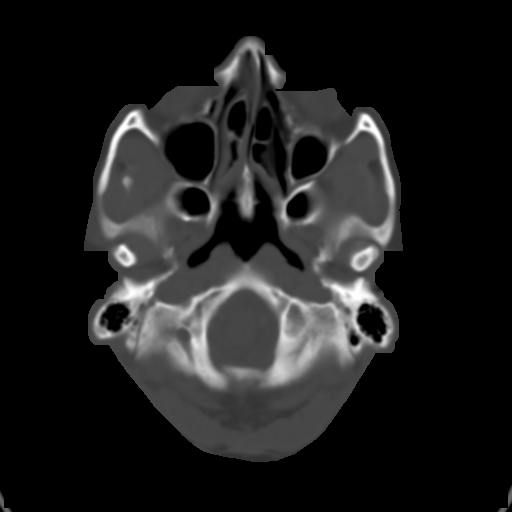
[im 9/33  brain]
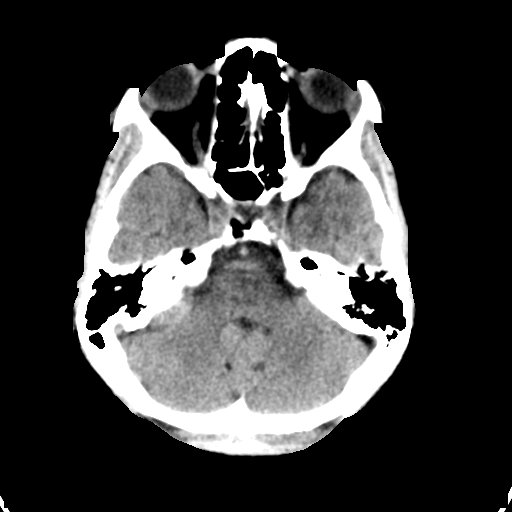
[im 13/33  brain]
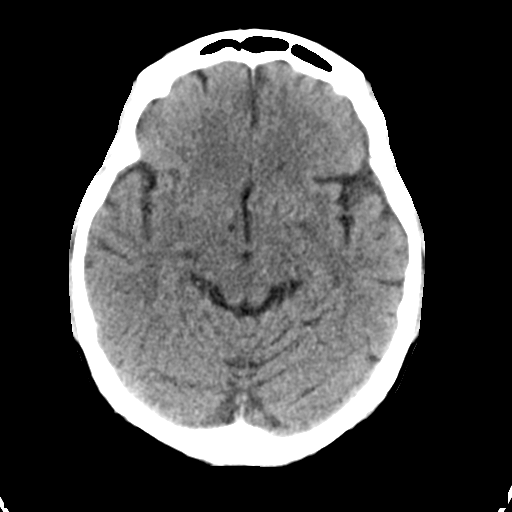
[im 17/33  brain]
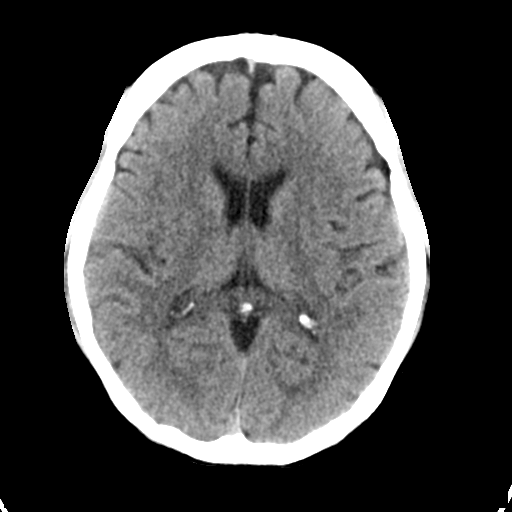
[im 21/33  brain]
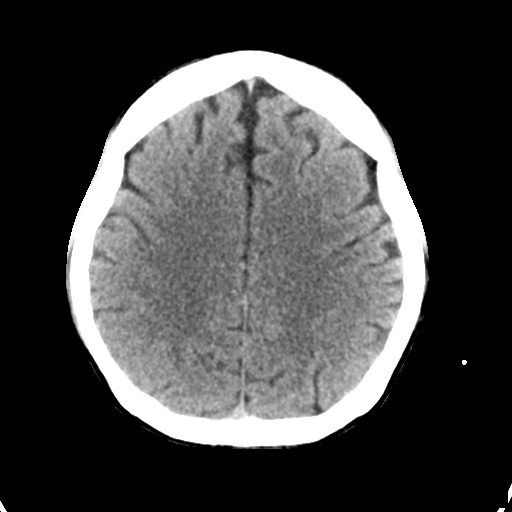
[im 21/33  bone]
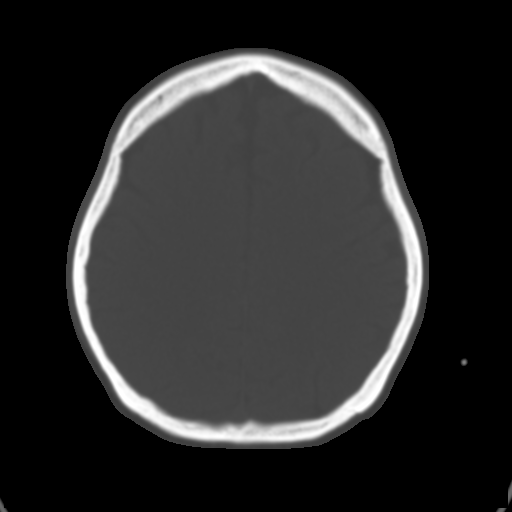
[im 25/33  brain]
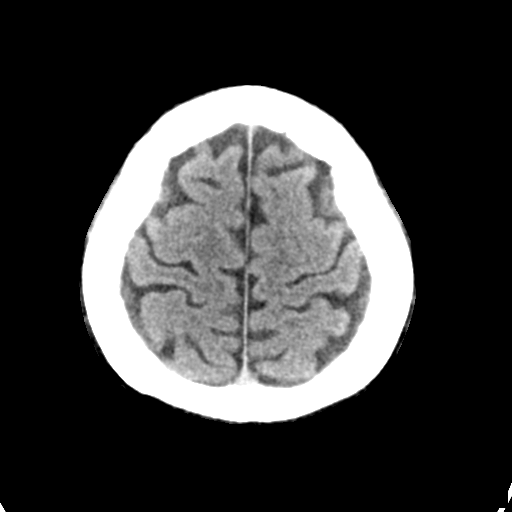
[im 29/33  brain]
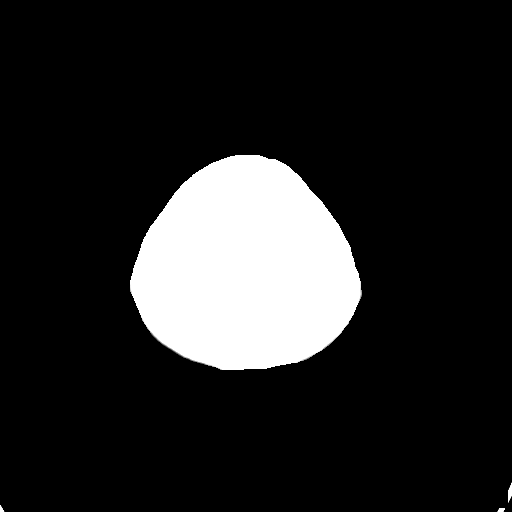

[Series 4: head bone · axial · 0.40mm/px · z∈[-85,-29]mm · 4 of 81 slices shown]
[im 9/81  bone]
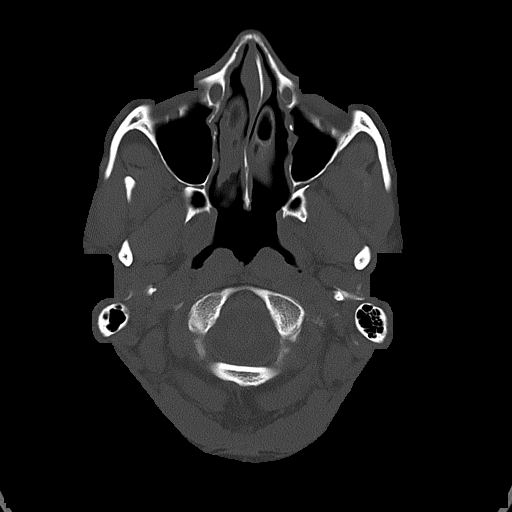
[im 17/81  bone]
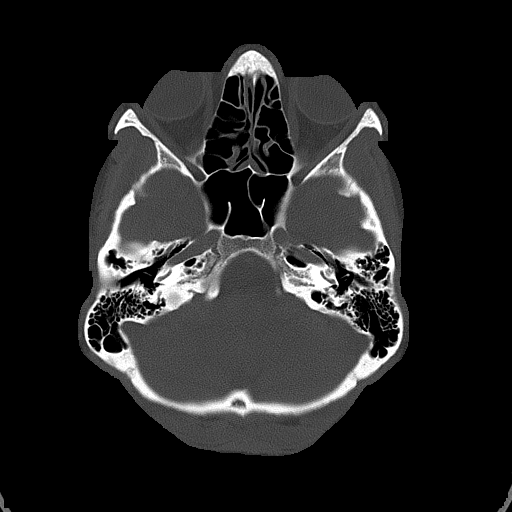
[im 25/81  bone]
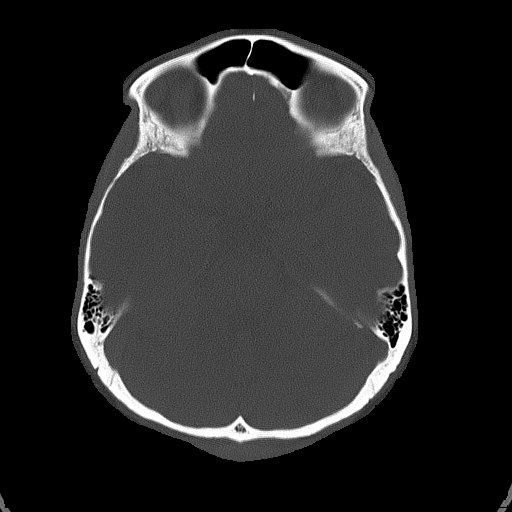
[im 37/81  bone]
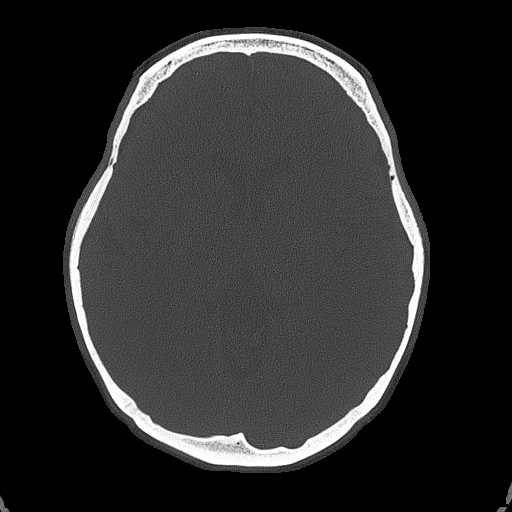

[Series 5: head without cor · coronal · non-contrast · 0.31mm/px · 3 of 67 slices shown]
[im 23/67  brain]
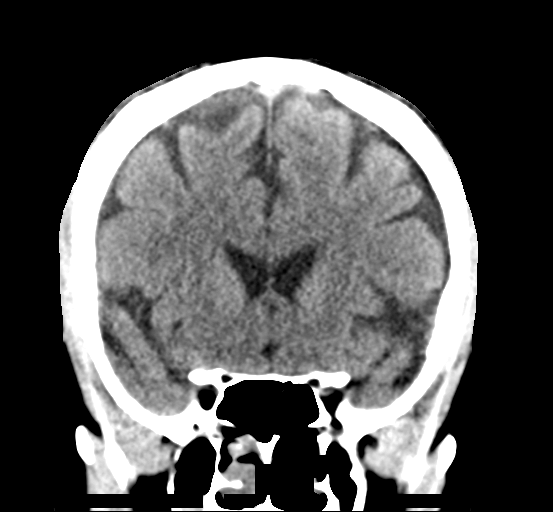
[im 30/67  brain]
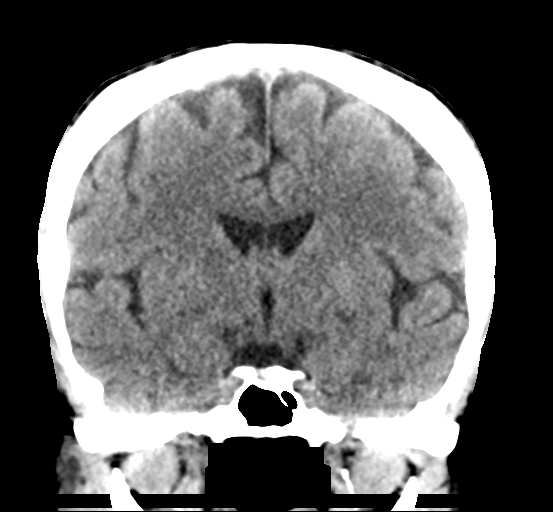
[im 37/67  brain]
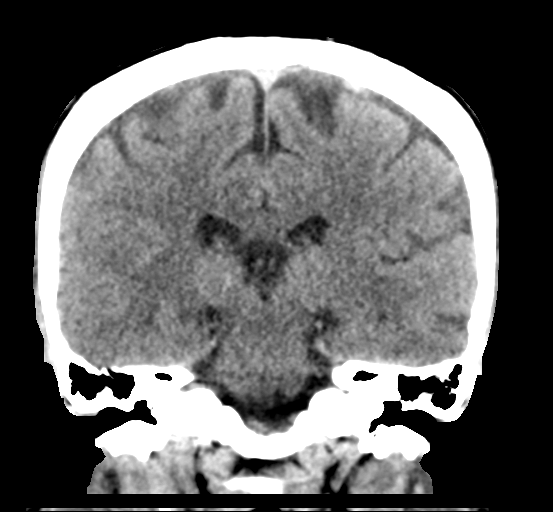

[Series 6: head without sag · sagittal · non-contrast · 0.31mm/px · 3 of 60 slices shown]
[im 20/60  brain]
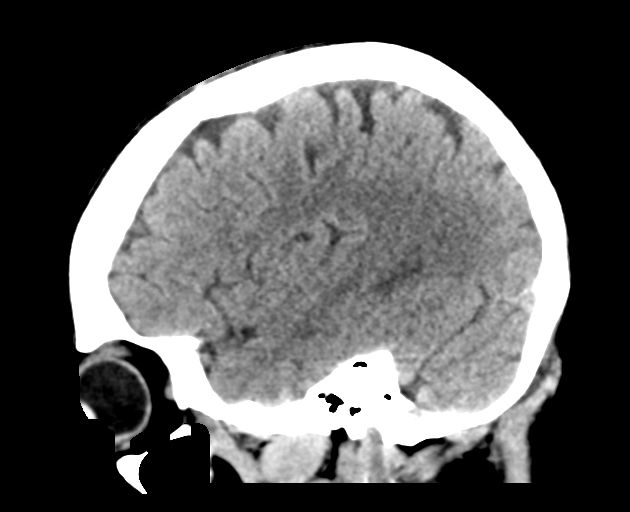
[im 30/60  brain]
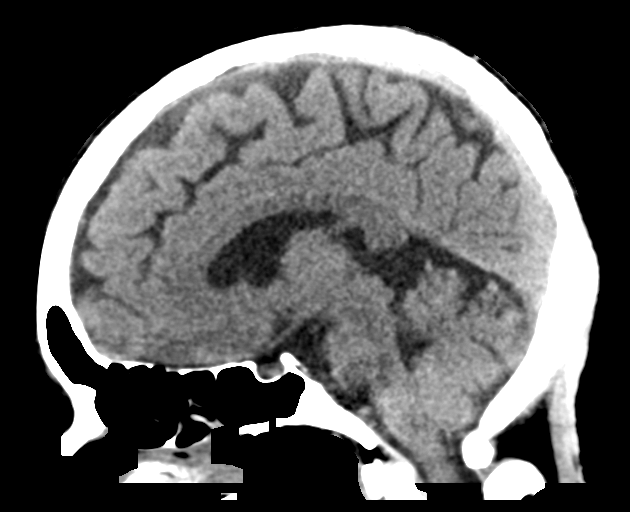
[im 40/60  brain]
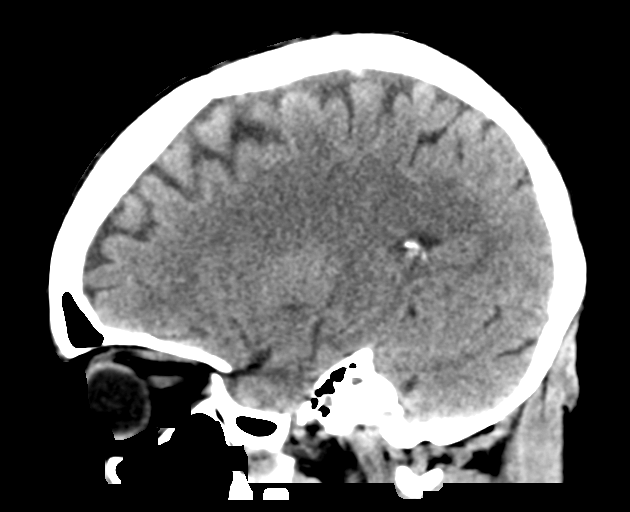

[17 of 47 positions shown; findings below may reference images not displayed]

FINDINGS: Brain: No evidence of acute territorial infarction, hemorrhage,
hydrocephalus,extra-axial collection or mass lesion/mass effect.
Normal gray-white differentiation. Ventricles are normal in size and
contour.

Vascular: No hyperdense vessel or unexpected calcification.

Skull: The skull is intact. No fracture or focal lesion identified.

Sinuses/Orbits: Small maxillary retention cysts on the right. The
orbits and globes intact.

Other: None
IMPRESSION: No acute intracranial abnormality.

## 2020-04-05 ENCOUNTER — Other Ambulatory Visit (HOSPITAL_COMMUNITY): Payer: Self-pay | Admitting: Psychiatry

## 2020-04-05 DIAGNOSIS — F23 Brief psychotic disorder: Secondary | ICD-10-CM | POA: Diagnosis not present

## 2020-04-05 DIAGNOSIS — F4312 Post-traumatic stress disorder, chronic: Secondary | ICD-10-CM | POA: Diagnosis not present

## 2020-04-05 DIAGNOSIS — F411 Generalized anxiety disorder: Secondary | ICD-10-CM | POA: Diagnosis not present

## 2020-04-05 MED FILL — PRAZOSIN HCL 1 MG CAPS: 1 | 30 days supply | Qty: 30 | Fill #0

## 2020-04-05 MED FILL — buPROPion HCL ER (XL) 300 M: 300 | 30 days supply | Qty: 30 | Fill #0

## 2020-04-06 MED FILL — CLONAZEPAM 1 MG TABS: 1 | 30 days supply | Qty: 30 | Fill #1

## 2020-04-06 MED FILL — PROPRANOLOL 40 MG TABLET: 40 | 30 days supply | Qty: 90 | Fill #1

## 2020-05-03 MED FILL — buPROPion HCL ER (XL) 300 M: 300 | 30 days supply | Qty: 30 | Fill #1

## 2020-05-03 MED FILL — CLONAZEPAM 1 MG TABS: 1 | 30 days supply | Qty: 30 | Fill #2

## 2020-05-03 MED FILL — PROPRANOLOL 40 MG TABLET: 40 | 30 days supply | Qty: 90 | Fill #2

## 2020-05-03 MED FILL — PRAZOSIN HCL 1 MG CAPS: 1 | 30 days supply | Qty: 30 | Fill #1

## 2020-05-29 ENCOUNTER — Other Ambulatory Visit (HOSPITAL_COMMUNITY): Payer: Self-pay | Admitting: Psychiatry

## 2020-05-29 DIAGNOSIS — F23 Brief psychotic disorder: Secondary | ICD-10-CM | POA: Diagnosis not present

## 2020-05-29 DIAGNOSIS — F4312 Post-traumatic stress disorder, chronic: Secondary | ICD-10-CM | POA: Diagnosis not present

## 2020-05-29 DIAGNOSIS — F411 Generalized anxiety disorder: Secondary | ICD-10-CM | POA: Diagnosis not present

## 2020-05-29 MED FILL — buPROPion HCL ER (XL) 150 M: 150 | 30 days supply | Qty: 30 | Fill #0

## 2020-05-29 MED FILL — traZODone HCL 100 MG TABS: 100 | 30 days supply | Qty: 90 | Fill #0

## 2020-05-29 MED FILL — PROPRANOLOL 40 MG TABLET: 40 | 30 days supply | Qty: 30 | Fill #0

## 2020-05-29 MED FILL — PRAZOSIN HCL 1 MG CAPS: 1 | 30 days supply | Qty: 30 | Fill #0

## 2020-05-29 MED FILL — buPROPion HCL ER (XL) 300 M: 300 | 30 days supply | Qty: 30 | Fill #0

## 2020-05-31 MED FILL — CLONAZEPAM 1 MG TABS: 1 | 30 days supply | Qty: 30 | Fill #0

## 2020-06-28 MED FILL — PRAZOSIN HCL 1 MG CAPS: 1 | 30 days supply | Qty: 30 | Fill #1

## 2020-06-28 MED FILL — traZODone HCL 100 MG TABS: 100 | 30 days supply | Qty: 90 | Fill #1

## 2020-06-28 MED FILL — buPROPion HCL ER (XL) 150 M: 150 | 30 days supply | Qty: 30 | Fill #1

## 2020-06-28 MED FILL — PROPRANOLOL 40 MG TABLET: 40 | 30 days supply | Qty: 30 | Fill #1

## 2020-06-28 MED FILL — buPROPion HCL ER (XL) 300 M: 300 | 30 days supply | Qty: 30 | Fill #1

## 2020-06-28 MED FILL — CLONAZEPAM 1 MG TABS: 1 | 30 days supply | Qty: 30 | Fill #1

## 2020-08-01 ENCOUNTER — Other Ambulatory Visit (HOSPITAL_COMMUNITY): Payer: Self-pay | Admitting: Psychiatry

## 2020-08-01 DIAGNOSIS — F23 Brief psychotic disorder: Secondary | ICD-10-CM | POA: Diagnosis not present

## 2020-08-01 DIAGNOSIS — F4312 Post-traumatic stress disorder, chronic: Secondary | ICD-10-CM | POA: Diagnosis not present

## 2020-08-01 DIAGNOSIS — F411 Generalized anxiety disorder: Secondary | ICD-10-CM | POA: Diagnosis not present

## 2020-08-01 MED FILL — CLONAZEPAM 1 MG TABS: 1 | 30 days supply | Qty: 30 | Fill #0

## 2020-08-01 MED FILL — traZODone HCL 100 MG TABS: 100 | 30 days supply | Qty: 90 | Fill #0

## 2020-08-01 MED FILL — PROPRANOLOL 40 MG TABLET: 40 | 30 days supply | Qty: 30 | Fill #0

## 2020-08-01 MED FILL — buPROPion HCL ER (XL) 300 M: 300 | 30 days supply | Qty: 30 | Fill #0

## 2020-08-01 MED FILL — PRAZOSIN HCL 1 MG CAPS: 1 | 30 days supply | Qty: 30 | Fill #0

## 2020-08-01 MED FILL — buPROPion HCL ER (XL) 150 M: 150 | 30 days supply | Qty: 30 | Fill #0

## 2020-09-02 MED FILL — CLONAZEPAM 1 MG TABS: 1 | 30 days supply | Qty: 30 | Fill #1

## 2020-09-02 MED FILL — buPROPion HCL ER (XL) 150 M: 150 | 30 days supply | Qty: 30 | Fill #1

## 2020-09-02 MED FILL — traZODone HCL 100 MG TABS: 100 | 30 days supply | Qty: 90 | Fill #1

## 2020-09-02 MED FILL — buPROPion HCL ER (XL) 300 M: 300 | 30 days supply | Qty: 30 | Fill #1

## 2020-09-02 MED FILL — PROPRANOLOL 40 MG TABLET: 40 | 30 days supply | Qty: 30 | Fill #1

## 2020-09-02 MED FILL — PRAZOSIN HCL 1 MG CAPS: 1 | 30 days supply | Qty: 30 | Fill #1

## 2020-09-30 MED FILL — PRAZOSIN HCL 1 MG CAPS: 1 | 30 days supply | Qty: 30 | Fill #2

## 2020-09-30 MED FILL — buPROPion HCL ER (XL) 150 M: 150 | 30 days supply | Qty: 30 | Fill #2

## 2020-09-30 MED FILL — PROPRANOLOL 40 MG TABLET: 40 | 30 days supply | Qty: 30 | Fill #2

## 2020-09-30 MED FILL — CLONAZEPAM 1 MG TABS: 1 | 30 days supply | Qty: 30 | Fill #2

## 2020-09-30 MED FILL — traZODone HCL 100 MG TABS: 100 | 30 days supply | Qty: 90 | Fill #2

## 2020-09-30 MED FILL — buPROPion HCL ER (XL) 300 M: 300 | 30 days supply | Qty: 30 | Fill #2

## 2020-10-24 ENCOUNTER — Other Ambulatory Visit (HOSPITAL_COMMUNITY): Payer: Self-pay

## 2020-10-24 DIAGNOSIS — F23 Brief psychotic disorder: Secondary | ICD-10-CM | POA: Diagnosis not present

## 2020-10-24 DIAGNOSIS — F411 Generalized anxiety disorder: Secondary | ICD-10-CM | POA: Diagnosis not present

## 2020-10-24 DIAGNOSIS — F4312 Post-traumatic stress disorder, chronic: Secondary | ICD-10-CM | POA: Diagnosis not present

## 2020-10-24 MED ORDER — BUPROPION HCL ER (XL) 300 MG PO TB24
ORAL_TABLET | ORAL | 2 refills | Status: AC
  Filled 2020-10-24 – 2020-11-04 (×2): qty 30, 30d supply, fill #0
  Filled 2020-12-12: qty 30, 30d supply, fill #1

## 2020-10-24 MED ORDER — BUPROPION HCL ER (XL) 150 MG PO TB24
ORAL_TABLET | ORAL | 2 refills | Status: AC
  Filled 2020-10-24 – 2020-11-04 (×2): qty 30, 30d supply, fill #0
  Filled 2020-12-12: qty 30, 30d supply, fill #1

## 2020-10-24 MED ORDER — TRAZODONE HCL 100 MG PO TABS
ORAL_TABLET | ORAL | 2 refills | Status: DC
  Filled 2020-10-24 – 2020-11-04 (×2): qty 90, 30d supply, fill #0
  Filled 2020-12-12: qty 90, 30d supply, fill #1

## 2020-10-24 MED ORDER — PROPRANOLOL HCL 40 MG PO TABS
ORAL_TABLET | ORAL | 2 refills | Status: DC
  Filled 2020-10-24: qty 30, 30d supply, fill #0

## 2020-10-24 MED ORDER — CLONAZEPAM 1 MG PO TABS
ORAL_TABLET | ORAL | 2 refills | Status: AC
  Filled 2020-10-24 – 2020-11-04 (×2): qty 30, 30d supply, fill #0
  Filled 2020-12-12: qty 30, 30d supply, fill #1

## 2020-10-24 MED ORDER — PRAZOSIN HCL 1 MG PO CAPS
ORAL_CAPSULE | ORAL | 2 refills | Status: AC
  Filled 2020-10-24 – 2020-11-04 (×2): qty 30, 30d supply, fill #0
  Filled 2020-12-12: qty 30, 30d supply, fill #1

## 2020-10-25 ENCOUNTER — Other Ambulatory Visit (HOSPITAL_COMMUNITY): Payer: Self-pay

## 2020-11-03 ENCOUNTER — Other Ambulatory Visit (HOSPITAL_COMMUNITY): Payer: Self-pay

## 2020-11-04 ENCOUNTER — Other Ambulatory Visit (HOSPITAL_COMMUNITY): Payer: Self-pay

## 2020-12-12 ENCOUNTER — Other Ambulatory Visit (HOSPITAL_COMMUNITY): Payer: Self-pay

## 2021-01-17 ENCOUNTER — Other Ambulatory Visit (HOSPITAL_COMMUNITY): Payer: Self-pay

## 2021-01-17 DIAGNOSIS — F331 Major depressive disorder, recurrent, moderate: Secondary | ICD-10-CM | POA: Diagnosis not present

## 2021-01-17 DIAGNOSIS — F411 Generalized anxiety disorder: Secondary | ICD-10-CM | POA: Diagnosis not present

## 2021-01-17 DIAGNOSIS — F4312 Post-traumatic stress disorder, chronic: Secondary | ICD-10-CM | POA: Diagnosis not present

## 2021-01-17 MED ORDER — PROPRANOLOL HCL 40 MG PO TABS
ORAL_TABLET | ORAL | 2 refills | Status: AC
  Filled 2021-01-17: qty 30, 30d supply, fill #0
  Filled 2021-04-07: qty 30, 30d supply, fill #1

## 2021-01-17 MED ORDER — BUPROPION HCL ER (XL) 150 MG PO TB24
ORAL_TABLET | ORAL | 2 refills | Status: AC
  Filled 2021-01-17: qty 30, 30d supply, fill #0
  Filled 2021-04-07: qty 30, 30d supply, fill #1
  Filled 2021-05-05: qty 30, 30d supply, fill #2

## 2021-01-17 MED ORDER — PRAZOSIN HCL 1 MG PO CAPS
ORAL_CAPSULE | ORAL | 2 refills | Status: AC
  Filled 2021-01-17: qty 30, 30d supply, fill #0
  Filled 2021-04-07: qty 30, 30d supply, fill #1

## 2021-01-17 MED ORDER — BUPROPION HCL ER (XL) 300 MG PO TB24
ORAL_TABLET | ORAL | 2 refills | Status: AC
  Filled 2021-01-17: qty 30, 30d supply, fill #0
  Filled 2021-04-07: qty 30, 30d supply, fill #1

## 2021-01-17 MED ORDER — TRAZODONE HCL 100 MG PO TABS
ORAL_TABLET | ORAL | 2 refills | Status: DC
  Filled 2021-01-17: qty 90, 30d supply, fill #0
  Filled 2021-04-07: qty 90, 30d supply, fill #1

## 2021-01-17 MED ORDER — CLONAZEPAM 1 MG PO TABS
ORAL_TABLET | ORAL | 2 refills | Status: AC
  Filled 2021-01-17: qty 30, 30d supply, fill #0
  Filled 2021-04-07: qty 30, 30d supply, fill #1

## 2021-01-18 ENCOUNTER — Other Ambulatory Visit (HOSPITAL_COMMUNITY): Payer: Self-pay

## 2021-04-07 ENCOUNTER — Other Ambulatory Visit (HOSPITAL_COMMUNITY): Payer: Self-pay

## 2021-04-17 ENCOUNTER — Other Ambulatory Visit (HOSPITAL_COMMUNITY): Payer: Self-pay

## 2021-04-17 DIAGNOSIS — F4312 Post-traumatic stress disorder, chronic: Secondary | ICD-10-CM | POA: Diagnosis not present

## 2021-04-17 DIAGNOSIS — F331 Major depressive disorder, recurrent, moderate: Secondary | ICD-10-CM | POA: Diagnosis not present

## 2021-04-17 DIAGNOSIS — F411 Generalized anxiety disorder: Secondary | ICD-10-CM | POA: Diagnosis not present

## 2021-04-17 MED ORDER — PRAZOSIN HCL 1 MG PO CAPS
1.0000 mg | ORAL_CAPSULE | Freq: Every day | ORAL | 2 refills | Status: AC
  Filled 2021-04-17 – 2021-06-23 (×3): qty 30, 30d supply, fill #0
  Filled 2022-01-17: qty 30, 30d supply, fill #1

## 2021-04-17 MED ORDER — PROPRANOLOL HCL 40 MG PO TABS
20.0000 mg | ORAL_TABLET | Freq: Every day | ORAL | 2 refills | Status: AC
  Filled 2021-04-17 – 2021-06-23 (×3): qty 30, 30d supply, fill #0

## 2021-04-17 MED ORDER — CLONAZEPAM 1 MG PO TABS
1.0000 mg | ORAL_TABLET | Freq: Every day | ORAL | 2 refills | Status: AC
  Filled 2021-04-17 – 2021-06-23 (×3): qty 30, 30d supply, fill #0
  Filled 2021-07-21: qty 30, 30d supply, fill #1
  Filled 2021-09-18: qty 30, 30d supply, fill #2

## 2021-04-17 MED ORDER — TRAZODONE HCL 100 MG PO TABS
ORAL_TABLET | ORAL | 2 refills | Status: AC
  Filled 2021-04-17: qty 90, 30d supply, fill #0

## 2021-04-17 MED ORDER — BUPROPION HCL ER (XL) 150 MG PO TB24
150.0000 mg | ORAL_TABLET | Freq: Every day | ORAL | 2 refills | Status: AC
  Filled 2021-04-17 – 2021-06-23 (×3): qty 30, 30d supply, fill #0

## 2021-04-17 MED ORDER — BUPROPION HCL ER (XL) 300 MG PO TB24
300.0000 mg | ORAL_TABLET | ORAL | 2 refills | Status: AC
  Filled 2021-04-17 – 2021-06-23 (×3): qty 30, 30d supply, fill #0

## 2021-05-01 ENCOUNTER — Other Ambulatory Visit (HOSPITAL_COMMUNITY): Payer: Self-pay

## 2021-05-05 ENCOUNTER — Other Ambulatory Visit (HOSPITAL_COMMUNITY): Payer: Self-pay

## 2021-05-05 MED FILL — Trazodone HCl Tab 100 MG: ORAL | 30 days supply | Qty: 90 | Fill #0 | Status: CN

## 2021-05-14 ENCOUNTER — Other Ambulatory Visit (HOSPITAL_COMMUNITY): Payer: Self-pay

## 2021-05-30 ENCOUNTER — Ambulatory Visit (HOSPITAL_COMMUNITY)
Admission: EM | Admit: 2021-05-30 | Discharge: 2021-05-30 | Disposition: A | Discharge status: Home / Self Care | Payer: 59 | Attending: Family Medicine | Admitting: Family Medicine

## 2021-05-30 ENCOUNTER — Encounter (HOSPITAL_COMMUNITY): Payer: Self-pay

## 2021-05-30 ENCOUNTER — Other Ambulatory Visit: Payer: Self-pay

## 2021-05-30 DIAGNOSIS — R519 Headache, unspecified: Secondary | ICD-10-CM | POA: Diagnosis not present

## 2021-05-30 DIAGNOSIS — M25561 Pain in right knee: Secondary | ICD-10-CM | POA: Diagnosis not present

## 2021-05-30 DIAGNOSIS — M25562 Pain in left knee: Secondary | ICD-10-CM

## 2021-05-30 MED ORDER — HYDROCODONE-ACETAMINOPHEN 5-325 MG PO TABS: 1.0000 | ORAL_TABLET | Freq: Four times a day (QID) | ORAL | 0 refills | Status: AC | PRN
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
## 2021-05-30 NOTE — Discharge Instructions (Signed)

## 2021-05-31 NOTE — ED Provider Notes (Signed)
   [facility]   [id number] 05/30/21 Arrival Time: 1855  ASSESSMENT & PLAN:  1. Facial pain   2. Acute pain of both knees    No indication for facial CT this evening.  Teeth intact except for chipped front tooth.  Meds ordered this encounter  Medications   HYDROcodone-acetaminophen (NORCO/VICODIN) 5-325 MG tablet    Sig: Take 1 tablet by mouth every 6 (six) hours as needed for moderate pain or severe pain.    Dispense:  8 tablet    Refill:  0     Discharge Instructions      Be aware, you have been prescribed pain medications that may cause drowsiness. While taking this medication, do not take any other medications containing acetaminophen (Tylenol). Do not combine with alcohol or other illicit drugs. Please do not drive, operate heavy machinery, or take part in activities that require making important decisions while on this medication as your judgement may be clouded.     Reviewed expectations re: course of current medical issues. Questions answered. Outlined signs and symptoms indicating need for more acute intervention. Patient verbalized understanding. After Visit Summary given.  SUBJECTIVE: History from: patient. Patient is able to give a clear and coherent history.   Patricia Liu is a 45 y.o. female who reports tripping and falling onto her face; today; pain around mouth mainly; no LOC. Ambulatory here. Did chip her tooth. Mild knee soreness bilaterally. No tx PTA.  OBJECTIVE:  Vitals:   05/30/21 1952  BP: (!) 125/94  Pulse: (!) 101  Temp: 98.3 F (36.8 C)  TempSrc: Oral     GCS: 15 General appearance: alert; no distress HEENT: normocephalic; abrasion over L upper lip; chipped front tooth; no loose teeth Neck: supple with FROM Lungs: unlabored Abdomen: soft, non-tender; no bruising Back: no midline tenderness Extremities: moves all extremities normally; no edema; symmetrical with no gross deformities; mild knee soreness reported Skin: warm  and dry Neurologic: speech is fluent and clear without dysarthria or aphasia; normal gait Psychological: alert and cooperative; normal mood and affect    Allergies  Allergen Reactions   Zosyn [Piperacillin Sod-Tazobactam So] Other (See Comments)    Hepatic reaction, LFTs increased dramatically. Did it involve swelling of the face/tongue/throat, SOB, or low BP? No Did it involve sudden or severe rash/hives, skin peeling, or any reaction on the inside of your mouth or nose? No Did you need to seek medical attention at a hospital or doctor's office? No When did it last happen? ~2005       If all above answers are "NO", may proceed with cephalosporin use.    Sulfa Antibiotics Swelling and Other (See Comments)    "Ocular swelling"   Past Medical History:  Diagnosis Date   Asthma    Past Surgical History:  Procedure Laterality Date   HIP ARTHROSCOPY W/ LABRAL REPAIR  1999   wisdom tooth  03/2011   Family History  Problem Relation Age of Onset   Cancer Paternal Glynda Jaeger, MD 05/31/21 [phone]

## 2021-06-23 ENCOUNTER — Other Ambulatory Visit (HOSPITAL_COMMUNITY): Payer: Self-pay

## 2021-07-11 ENCOUNTER — Other Ambulatory Visit (HOSPITAL_COMMUNITY): Payer: Self-pay

## 2021-07-11 DIAGNOSIS — F411 Generalized anxiety disorder: Secondary | ICD-10-CM | POA: Diagnosis not present

## 2021-07-11 DIAGNOSIS — F331 Major depressive disorder, recurrent, moderate: Secondary | ICD-10-CM | POA: Diagnosis not present

## 2021-07-11 DIAGNOSIS — F4312 Post-traumatic stress disorder, chronic: Secondary | ICD-10-CM | POA: Diagnosis not present

## 2021-07-11 MED ORDER — CLONAZEPAM 1 MG PO TABS: 1.0000 mg | ORAL_TABLET | Freq: Every evening | ORAL | 2 refills | Status: DC | PRN

## 2021-07-11 MED ORDER — BUPROPION HCL ER (XL) 300 MG PO TB24
ORAL_TABLET | ORAL | 2 refills | Status: AC
  Filled 2021-07-11: qty 30, 30d supply, fill #0
  Filled 2021-09-18: qty 30, 30d supply, fill #1

## 2021-07-11 MED ORDER — TRAZODONE HCL 100 MG PO TABS
ORAL_TABLET | ORAL | 2 refills | Status: AC
  Filled 2021-07-11: qty 90, 30d supply, fill #0
  Filled 2021-09-18: qty 90, 30d supply, fill #1
  Filled 2022-01-17: qty 90, 30d supply, fill #2

## 2021-07-11 MED ORDER — PROPRANOLOL HCL 40 MG PO TABS
ORAL_TABLET | ORAL | 2 refills | Status: AC
  Filled 2021-07-11: qty 30, 30d supply, fill #0
  Filled 2021-09-18: qty 30, 30d supply, fill #1

## 2021-07-11 MED ORDER — PRAZOSIN HCL 1 MG PO CAPS
ORAL_CAPSULE | ORAL | 2 refills | Status: AC
  Filled 2021-07-11: qty 30, 30d supply, fill #0
  Filled 2021-09-18: qty 30, 30d supply, fill #1

## 2021-07-11 MED ORDER — BUPROPION HCL ER (XL) 150 MG PO TB24
ORAL_TABLET | ORAL | 2 refills | Status: AC
  Filled 2021-07-11: qty 30, 30d supply, fill #0
  Filled 2021-09-18: qty 30, 30d supply, fill #1

## 2021-07-17 ENCOUNTER — Other Ambulatory Visit (HOSPITAL_COMMUNITY): Payer: Self-pay

## 2021-07-18 ENCOUNTER — Other Ambulatory Visit (HOSPITAL_COMMUNITY): Payer: Self-pay

## 2021-07-20 ENCOUNTER — Other Ambulatory Visit (HOSPITAL_COMMUNITY): Payer: Self-pay

## 2021-07-22 ENCOUNTER — Other Ambulatory Visit (HOSPITAL_COMMUNITY): Payer: Self-pay

## 2021-07-23 ENCOUNTER — Other Ambulatory Visit (HOSPITAL_COMMUNITY): Payer: Self-pay

## 2021-07-28 ENCOUNTER — Other Ambulatory Visit (HOSPITAL_COMMUNITY): Payer: Self-pay

## 2021-08-03 ENCOUNTER — Other Ambulatory Visit (HOSPITAL_COMMUNITY): Payer: Self-pay

## 2021-08-04 ENCOUNTER — Other Ambulatory Visit (HOSPITAL_COMMUNITY): Payer: Self-pay

## 2021-09-18 ENCOUNTER — Other Ambulatory Visit (HOSPITAL_COMMUNITY): Payer: Self-pay

## 2021-09-26 ENCOUNTER — Other Ambulatory Visit (HOSPITAL_COMMUNITY): Payer: Self-pay

## 2021-09-26 DIAGNOSIS — F411 Generalized anxiety disorder: Secondary | ICD-10-CM | POA: Diagnosis not present

## 2021-09-26 DIAGNOSIS — F331 Major depressive disorder, recurrent, moderate: Secondary | ICD-10-CM | POA: Diagnosis not present

## 2021-09-26 DIAGNOSIS — F4312 Post-traumatic stress disorder, chronic: Secondary | ICD-10-CM | POA: Diagnosis not present

## 2021-09-26 MED ORDER — BUPROPION HCL ER (XL) 150 MG PO TB24
ORAL_TABLET | ORAL | 2 refills | Status: AC
  Filled 2021-09-26: qty 30, 30d supply, fill #0

## 2021-09-26 MED ORDER — PRAZOSIN HCL 1 MG PO CAPS
ORAL_CAPSULE | ORAL | 2 refills | Status: AC
  Filled 2021-09-26 – 2021-10-12 (×4): qty 30, 30d supply, fill #0

## 2021-09-26 MED ORDER — TRAZODONE HCL 100 MG PO TABS
ORAL_TABLET | ORAL | 2 refills | Status: AC
  Filled 2021-09-26 – 2021-10-12 (×2): qty 90, 30d supply, fill #0

## 2021-09-26 MED ORDER — PROPRANOLOL HCL 40 MG PO TABS
ORAL_TABLET | ORAL | 2 refills | Status: AC
  Filled 2021-09-26 – 2021-10-12 (×2): qty 30, 30d supply, fill #0

## 2021-09-26 MED ORDER — CLONAZEPAM 1 MG PO TABS
ORAL_TABLET | ORAL | 2 refills | Status: DC
  Filled 2021-09-26 – 2021-10-18 (×2): qty 30, 30d supply, fill #0

## 2021-09-26 MED ORDER — BUPROPION HCL ER (XL) 300 MG PO TB24
ORAL_TABLET | ORAL | 2 refills | Status: AC
  Filled 2021-09-26 – 2021-10-12 (×2): qty 30, 30d supply, fill #0

## 2021-10-12 ENCOUNTER — Other Ambulatory Visit (HOSPITAL_COMMUNITY): Payer: Self-pay

## 2021-10-15 ENCOUNTER — Other Ambulatory Visit (HOSPITAL_COMMUNITY): Payer: Self-pay

## 2021-10-18 ENCOUNTER — Other Ambulatory Visit (HOSPITAL_COMMUNITY): Payer: Self-pay

## 2021-12-13 ENCOUNTER — Other Ambulatory Visit (HOSPITAL_COMMUNITY): Payer: Self-pay

## 2021-12-13 DIAGNOSIS — F331 Major depressive disorder, recurrent, moderate: Secondary | ICD-10-CM | POA: Diagnosis not present

## 2021-12-13 DIAGNOSIS — F4312 Post-traumatic stress disorder, chronic: Secondary | ICD-10-CM | POA: Diagnosis not present

## 2021-12-13 DIAGNOSIS — F411 Generalized anxiety disorder: Secondary | ICD-10-CM | POA: Diagnosis not present

## 2021-12-13 MED ORDER — PRAZOSIN HCL 1 MG PO CAPS
ORAL_CAPSULE | ORAL | 2 refills | Status: AC
  Filled 2021-12-13: qty 30, 30d supply, fill #0

## 2021-12-13 MED ORDER — GABAPENTIN 300 MG PO CAPS
ORAL_CAPSULE | ORAL | 2 refills | Status: DC
  Filled 2021-12-13: qty 90, 30d supply, fill #0

## 2021-12-13 MED ORDER — TRAZODONE HCL 100 MG PO TABS
ORAL_TABLET | ORAL | 2 refills | Status: AC
  Filled 2021-12-13: qty 90, 30d supply, fill #0

## 2021-12-13 MED ORDER — PROPRANOLOL HCL 40 MG PO TABS
ORAL_TABLET | ORAL | 2 refills | Status: AC
  Filled 2021-12-13: qty 30, 30d supply, fill #0

## 2022-01-15 ENCOUNTER — Other Ambulatory Visit (HOSPITAL_COMMUNITY): Payer: Self-pay

## 2022-01-15 MED ORDER — CLONAZEPAM 1 MG PO TABS
ORAL_TABLET | ORAL | 1 refills | Status: AC
  Filled 2022-01-15: qty 30, 30d supply, fill #0

## 2022-01-15 MED ORDER — BUPROPION HCL ER (XL) 300 MG PO TB24
ORAL_TABLET | ORAL | 1 refills | Status: AC
  Filled 2022-01-15: qty 30, 30d supply, fill #0

## 2022-01-15 MED ORDER — BUPROPION HCL ER (XL) 150 MG PO TB24
ORAL_TABLET | ORAL | 1 refills | Status: AC
  Filled 2022-01-15: qty 30, 30d supply, fill #0

## 2022-01-17 ENCOUNTER — Other Ambulatory Visit (HOSPITAL_COMMUNITY): Payer: Self-pay

## 2022-02-07 DIAGNOSIS — R404 Transient alteration of awareness: Secondary | ICD-10-CM | POA: Diagnosis not present

## 2022-02-19 DIAGNOSIS — 419620001 Death: Secondary | SNOMED CT | POA: Diagnosis not present

## 2022-02-19 DEATH — deceased
